# Patient Record
Sex: Male | Born: 1985 | Race: White | Hispanic: No | Marital: Married | Smoking: Never smoker
Health system: Southern US, Community
[De-identification: ages and names within clinical notes are randomized; demographics above are authoritative.]

## PROBLEM LIST (undated history)

## (undated) DIAGNOSIS — F32A Depression, unspecified: Secondary | ICD-10-CM

## (undated) DIAGNOSIS — E538 Deficiency of other specified B group vitamins: Secondary | ICD-10-CM

## (undated) DIAGNOSIS — R51 Headache: Secondary | ICD-10-CM

## (undated) DIAGNOSIS — T7840XA Allergy, unspecified, initial encounter: Secondary | ICD-10-CM

## (undated) DIAGNOSIS — E785 Hyperlipidemia, unspecified: Secondary | ICD-10-CM

## (undated) DIAGNOSIS — F988 Other specified behavioral and emotional disorders with onset usually occurring in childhood and adolescence: Secondary | ICD-10-CM

## (undated) DIAGNOSIS — K648 Other hemorrhoids: Secondary | ICD-10-CM

## (undated) DIAGNOSIS — B019 Varicella without complication: Secondary | ICD-10-CM

## (undated) DIAGNOSIS — G43909 Migraine, unspecified, not intractable, without status migrainosus: Secondary | ICD-10-CM

## (undated) DIAGNOSIS — F329 Major depressive disorder, single episode, unspecified: Secondary | ICD-10-CM

## (undated) DIAGNOSIS — K219 Gastro-esophageal reflux disease without esophagitis: Secondary | ICD-10-CM

## (undated) DIAGNOSIS — F419 Anxiety disorder, unspecified: Secondary | ICD-10-CM

## (undated) HISTORY — DX: Gastro-esophageal reflux disease without esophagitis: K21.9

## (undated) HISTORY — DX: Other specified behavioral and emotional disorders with onset usually occurring in childhood and adolescence: F98.8

## (undated) HISTORY — DX: Deficiency of other specified B group vitamins: E53.8

## (undated) HISTORY — DX: Major depressive disorder, single episode, unspecified: F32.9

## (undated) HISTORY — DX: Varicella without complication: B01.9

## (undated) HISTORY — DX: Other hemorrhoids: K64.8

## (undated) HISTORY — DX: Depression, unspecified: F32.A

## (undated) HISTORY — DX: Headache: R51

## (undated) HISTORY — PX: WISDOM TOOTH EXTRACTION: SHX21

## (undated) HISTORY — DX: Migraine, unspecified, not intractable, without status migrainosus: G43.909

## (undated) HISTORY — DX: Allergy, unspecified, initial encounter: T78.40XA

## (undated) HISTORY — DX: Anxiety disorder, unspecified: F41.9

## (undated) HISTORY — DX: Hyperlipidemia, unspecified: E78.5

---

## 2012-06-20 DIAGNOSIS — Z9109 Other allergy status, other than to drugs and biological substances: Secondary | ICD-10-CM | POA: Insufficient documentation

## 2012-09-27 ENCOUNTER — Ambulatory Visit (INDEPENDENT_AMBULATORY_CARE_PROVIDER_SITE_OTHER): Payer: 59 | Admitting: Family Medicine

## 2012-09-27 ENCOUNTER — Encounter: Payer: Self-pay | Admitting: Family Medicine

## 2012-09-27 VITALS — BP 120/84 | HR 72 | Temp 97.9°F | Resp 12 | Ht 71.0 in | Wt 200.0 lb

## 2012-09-27 DIAGNOSIS — K219 Gastro-esophageal reflux disease without esophagitis: Secondary | ICD-10-CM

## 2012-09-27 DIAGNOSIS — F329 Major depressive disorder, single episode, unspecified: Secondary | ICD-10-CM

## 2012-09-27 DIAGNOSIS — E781 Pure hyperglyceridemia: Secondary | ICD-10-CM | POA: Insufficient documentation

## 2012-09-27 DIAGNOSIS — F32A Depression, unspecified: Secondary | ICD-10-CM

## 2012-09-27 DIAGNOSIS — E785 Hyperlipidemia, unspecified: Secondary | ICD-10-CM

## 2012-09-27 DIAGNOSIS — F3289 Other specified depressive episodes: Secondary | ICD-10-CM

## 2012-09-27 MED ORDER — OMEPRAZOLE 40 MG PO CPDR: 40.0000 mg | DELAYED_RELEASE_CAPSULE | Freq: Every day | ORAL | Status: DC

## 2012-09-27 MED ORDER — OMEGA-3-ACID ETHYL ESTERS 1 G PO CAPS: 1.0000 g | ORAL_CAPSULE | Freq: Two times a day (BID) | ORAL | Status: DC

## 2012-09-27 NOTE — Progress Notes (Signed)
  Subjective:    Patient ID: Timothy Foster, male    DOB: 1986/05/24, 27 y.o.   MRN: [id number]  HPI New to establish care. Patient has history of GERD, past history depression and anxiety and possible generalized anxiety, dyslipidemia. He's been exercising more and has lost some weight this past year. He reports triglycerides previously around 1500. Started on Lovaza several months ago and no followup lipid since then. No regular alcohol use. No history of diabetes. He also has history of seasonal and perennial allergies and takes Allegra for that. Anxiety symptoms are stable on Effexor.  Family history significant for mother with hepatitis C and liver transplant. Both parents with type 2 diabetes. Father had stroke and hypertension history.  Patient is single. Flight attendant. Nonsmoker. Rare alcohol use.  Past Medical History  Diagnosis Date  . Chicken pox   . Depression   . Headache   . Allergy   . Hyperlipidemia    History reviewed. No pertinent past surgical history.  reports that he has never smoked. He does not have any smokeless tobacco history on file. His alcohol and drug histories are not on file. family history includes Alcohol abuse in his maternal uncle and paternal uncle; Cancer in his paternal uncle; Diabetes in his father and mother; and Stroke in his father. Allergies  Allergen Reactions  . Penicillins     Hives as a child      Review of Systems  Constitutional: Negative for fatigue.  Eyes: Negative for visual disturbance.  Respiratory: Negative for cough, chest tightness and shortness of breath.   Cardiovascular: Negative for chest pain, palpitations and leg swelling.  Neurological: Negative for dizziness, syncope, weakness, light-headedness and headaches.       Objective:   Physical Exam  Constitutional: He appears well-developed and well-nourished.  HENT:  Right Ear: External ear normal.  Left Ear: External ear normal.  Neck: Neck supple. No  thyromegaly present.  Cardiovascular: Normal rate and regular rhythm.   No murmur heard. Pulmonary/Chest: Effort normal and breath sounds normal. No respiratory distress. He has no wheezes. He has no rales.  Lymphadenopathy:    He has no cervical adenopathy.          Assessment & Plan:  #1 GERD. Symptomatically stable. Refilled omeprazole for one year.  Chronic omeprazole used with occasional leg cramps. Check magnesium level #2 dyslipidemia with especially high triglycerides. Ordered repeat fasting lipid in about one month. #3 history of chronic anxiety/depression currently stable on Effexor XR.

## 2012-09-27 NOTE — Patient Instructions (Signed)
Hypertriglyceridemia  Diet for High blood levels of Triglycerides Most fats in food are triglycerides. Triglycerides in your blood are stored as fat in your body. High levels of triglycerides in your blood may put you at a greater risk for heart disease and stroke.  Normal triglyceride levels are less than 150 mg/dL. Borderline high levels are 150-199 mg/dl. High levels are 200 - 499 mg/dL, and very high triglyceride levels are greater than 500 mg/dL. The decision to treat high triglycerides is generally based on the level. For people with borderline or high triglyceride levels, treatment includes weight loss and exercise. Drugs are recommended for people with very high triglyceride levels. Many people who need treatment for high triglyceride levels have metabolic syndrome. This syndrome is a collection of disorders that often include: insulin resistance, high blood pressure, blood clotting problems, high cholesterol and triglycerides. TESTING PROCEDURE FOR TRIGLYCERIDES  You should not eat 4 hours before getting your triglycerides measured. The normal range of triglycerides is between 10 and 250 milligrams per deciliter (mg/dl). Some people may have extreme levels (1000 or above), but your triglyceride level may be too high if it is above 150 mg/dl, depending on what other risk factors you have for heart disease.  People with high blood triglycerides may also have high blood cholesterol levels. If you have high blood cholesterol as well as high blood triglycerides, your risk for heart disease is probably greater than if you only had high triglycerides. High blood cholesterol is one of the main risk factors for heart disease. CHANGING YOUR DIET  Your weight can affect your blood triglyceride level. If you are more than 20% above your ideal body weight, you may be able to lower your blood triglycerides by losing weight. Eating less and exercising regularly is the best way to combat this. Fat provides more  calories than any other food. The best way to lose weight is to eat less fat. Only 30% of your total calories should come from fat. Less than 7% of your diet should come from saturated fat. A diet low in fat and saturated fat is the same as a diet to decrease blood cholesterol. By eating a diet lower in fat, you may lose weight, lower your blood cholesterol, and lower your blood triglyceride level.  Eating a diet low in fat, especially saturated fat, may also help you lower your blood triglyceride level. Ask your dietitian to help you figure how much fat you can eat based on the number of calories your caregiver has prescribed for you.  Exercise, in addition to helping with weight loss may also help lower triglyceride levels.   Alcohol can increase blood triglycerides. You may need to stop drinking alcoholic beverages.  Too much carbohydrate in your diet may also increase your blood triglycerides. Some complex carbohydrates are necessary in your diet. These may include bread, rice, potatoes, other starchy vegetables and cereals.  Reduce "simple" carbohydrates. These may include pure sugars, candy, honey, and jelly without losing other nutrients. If you have the kind of high blood triglycerides that is affected by the amount of carbohydrates in your diet, you will need to eat less sugar and less high-sugar foods. Your caregiver can help you with this.  Adding 2-4 grams of fish oil (EPA+ DHA) may also help lower triglycerides. Speak with your caregiver before adding any supplements to your regimen. Following the Diet  Maintain your ideal weight. Your caregivers can help you with a diet. Generally, eating less food and getting more   exercise will help you lose weight. Joining a weight control group may also help. Ask your caregivers for a good weight control group in your area.  Eat low-fat foods instead of high-fat foods. This can help you lose weight too.  These foods are lower in fat. Eat MORE of these:    Dried beans, peas, and lentils.  Egg whites.  Low-fat cottage cheese.  Fish.  Lean cuts of meat, such as round, sirloin, rump, and flank (cut extra fat off meat you fix).  Whole grain breads, cereals and pasta.  Skim and nonfat dry milk.  Low-fat yogurt.  Poultry without the skin.  Cheese made with skim or part-skim milk, such as mozzarella, parmesan, farmers', ricotta, or pot cheese. These are higher fat foods. Eat LESS of these:   Whole milk and foods made from whole milk, such as American, blue, cheddar, monterey jack, and swiss cheese  High-fat meats, such as luncheon meats, sausages, knockwurst, bratwurst, hot dogs, ribs, corned beef, ground pork, and regular ground beef.  Fried foods. Limit saturated fats in your diet. Substituting unsaturated fat for saturated fat may decrease your blood triglyceride level. You will need to read package labels to know which products contain saturated fats.  These foods are high in saturated fat. Eat LESS of these:   Fried pork skins.  Whole milk.  Skin and fat from poultry.  Palm oil.  Butter.  Shortening.  Cream cheese.  Bacon.  Margarines and baked goods made from listed oils.  Vegetable shortenings.  Chitterlings.  Fat from meats.  Coconut oil.  Palm kernel oil.  Lard.  Cream.  Sour cream.  Fatback.  Coffee whiteners and non-dairy creamers made with these oils.  Cheese made from whole milk. Use unsaturated fats (both polyunsaturated and monounsaturated) moderately. Remember, even though unsaturated fats are better than saturated fats; you still want a diet low in total fat.  These foods are high in unsaturated fat:   Canola oil.  Sunflower oil.  Mayonnaise.  Almonds.  Peanuts.  Pine nuts.  Margarines made with these oils.  Safflower oil.  Olive oil.  Avocados.  Cashews.  Peanut butter.  Sunflower seeds.  Soybean oil.  Peanut  oil.  Olives.  Pecans.  Walnuts.  Pumpkin seeds. Avoid sugar and other high-sugar foods. This will decrease carbohydrates without decreasing other nutrients. Sugar in your food goes rapidly to your blood. When there is excess sugar in your blood, your liver may use it to make more triglycerides. Sugar also contains calories without other important nutrients.  Eat LESS of these:   Sugar, brown sugar, powdered sugar, jam, jelly, preserves, honey, syrup, molasses, pies, candy, cakes, cookies, frosting, pastries, colas, soft drinks, punches, fruit drinks, and regular gelatin.  Avoid alcohol. Alcohol, even more than sugar, may increase blood triglycerides. In addition, alcohol is high in calories and low in nutrients. Ask for sparkling water, or a diet soft drink instead of an alcoholic beverage. Suggestions for planning and preparing meals   Bake, broil, grill or roast meats instead of frying.  Remove fat from meats and skin from poultry before cooking.  Add spices, herbs, lemon juice or vinegar to vegetables instead of salt, rich sauces or gravies.  Use a non-stick skillet without fat or use no-stick sprays.  Cool and refrigerate stews and broth. Then remove the hardened fat floating on the surface before serving.  Refrigerate meat drippings and skim off fat to make low-fat gravies.  Serve more fish.  Use less butter,   margarine and other high-fat spreads on bread or vegetables.  Use skim or reconstituted non-fat dry milk for cooking.  Cook with low-fat cheeses.  Substitute low-fat yogurt or cottage cheese for all or part of the sour cream in recipes for sauces, dips or congealed salads.  Use half yogurt/half mayonnaise in salad recipes.  Substitute evaporated skim milk for cream. Evaporated skim milk or reconstituted non-fat dry milk can be whipped and substituted for whipped cream in certain recipes.  Choose fresh fruits for dessert instead of high-fat foods such as pies or  cakes. Fruits are naturally low in fat. When Dining Out   Order low-fat appetizers such as fruit or vegetable juice, pasta with vegetables or tomato sauce.  Select clear, rather than cream soups.  Ask that dressings and gravies be served on the side. Then use less of them.  Order foods that are baked, broiled, poached, steamed, stir-fried, or roasted.  Ask for margarine instead of butter, and use only a small amount.  Drink sparkling water, unsweetened tea or coffee, or diet soft drinks instead of alcohol or other sweet beverages. QUESTIONS AND ANSWERS ABOUT OTHER FATS IN THE BLOOD: SATURATED FAT, TRANS FAT, AND CHOLESTEROL What is trans fat? Trans fat is a type of fat that is formed when vegetable oil is hardened through a process called hydrogenation. This process helps makes foods more solid, gives them shape, and prolongs their shelf life. Trans fats are also called hydrogenated or partially hydrogenated oils.  What do saturated fat, trans fat, and cholesterol in foods have to do with heart disease? Saturated fat, trans fat, and cholesterol in the diet all raise the level of LDL "bad" cholesterol in the blood. The higher the LDL cholesterol, the greater the risk for coronary heart disease (CHD). Saturated fat and trans fat raise LDL similarly.  What foods contain saturated fat, trans fat, and cholesterol? High amounts of saturated fat are found in animal products, such as fatty cuts of meat, chicken skin, and full-fat dairy products like butter, whole milk, cream, and cheese, and in tropical vegetable oils such as palm, palm kernel, and coconut oil. Trans fat is found in some of the same foods as saturated fat, such as vegetable shortening, some margarines (especially hard or stick margarine), crackers, cookies, baked goods, fried foods, salad dressings, and other processed foods made with partially hydrogenated vegetable oils. Small amounts of trans fat also occur naturally in some animal  products, such as milk products, beef, and lamb. Foods high in cholesterol include liver, other organ meats, egg yolks, shrimp, and full-fat dairy products. How can I use the new food label to make heart-healthy food choices? Check the Nutrition Facts panel of the food label. Choose foods lower in saturated fat, trans fat, and cholesterol. For saturated fat and cholesterol, you can also use the Percent Daily Value (%DV): 5% DV or less is low, and 20% DV or more is high. (There is no %DV for trans fat.) Use the Nutrition Facts panel to choose foods low in saturated fat and cholesterol, and if the trans fat is not listed, read the ingredients and limit products that list shortening or hydrogenated or partially hydrogenated vegetable oil, which tend to be high in trans fat. POINTS TO REMEMBER:   Discuss your risk for heart disease with your caregivers, and take steps to reduce risk factors.  Change your diet. Choose foods that are low in saturated fat, trans fat, and cholesterol.  Add exercise to your daily routine if   it is not already being done. Participate in physical activity of moderate intensity, like brisk walking, for at least 30 minutes on most, and preferably all days of the week. No time? Break the 30 minutes into three, 10-minute segments during the day.  Stop smoking. If you do smoke, contact your caregiver to discuss ways in which they can help you quit.  Do not use street drugs.  Maintain a normal weight.  Maintain a healthy blood pressure.  Keep up with your blood work for checking the fats in your blood as directed by your caregiver. Document Released: 05/06/2004 Document Revised: 01/18/2012 Document Reviewed: 12/02/2008 [employer] Patient Information 2013 [employer].  

## 2012-10-20 ENCOUNTER — Other Ambulatory Visit (INDEPENDENT_AMBULATORY_CARE_PROVIDER_SITE_OTHER): Payer: 59

## 2012-10-20 DIAGNOSIS — E785 Hyperlipidemia, unspecified: Secondary | ICD-10-CM

## 2012-10-20 LAB — LIPID PANEL
Cholesterol: 139 mg/dL (ref 0–200)
HDL: 26.2 mg/dL — ABNORMAL LOW (ref >39.00)
Total CHOL/HDL Ratio: 5
Triglycerides: 382 mg/dL — ABNORMAL HIGH (ref 0.0–149.0)
VLDL: 76.4 mg/dL — ABNORMAL HIGH (ref 0.0–40.0)

## 2012-10-20 LAB — LDL CHOLESTEROL, DIRECT: Direct LDL: 53.4 mg/dL

## 2012-10-20 LAB — MAGNESIUM: Magnesium: 2 mg/dL (ref 1.5–2.5)

## 2012-10-23 ENCOUNTER — Encounter: Payer: Self-pay | Admitting: Family Medicine

## 2012-10-23 NOTE — Progress Notes (Signed)
Quick Note:  I mailed labs to pt home as his phone number "is restricted" ______

## 2012-12-26 ENCOUNTER — Encounter: Payer: Self-pay | Admitting: Family Medicine

## 2012-12-26 ENCOUNTER — Ambulatory Visit (INDEPENDENT_AMBULATORY_CARE_PROVIDER_SITE_OTHER): Payer: 59 | Admitting: Family Medicine

## 2012-12-26 VITALS — BP 130/90 | HR 108 | Temp 98.2°F | Wt 196.0 lb

## 2012-12-26 DIAGNOSIS — J019 Acute sinusitis, unspecified: Secondary | ICD-10-CM

## 2012-12-26 MED ORDER — CEFUROXIME AXETIL 500 MG PO TABS: 500.0000 mg | ORAL_TABLET | Freq: Two times a day (BID) | ORAL | Status: DC

## 2012-12-26 NOTE — Progress Notes (Signed)
  Subjective:    Patient ID: Timothy Foster, male    DOB: [birth year], 27 y.o.   MRN: [id number]  HPI Here for 10 days of sinus pressure, HA, PND, and ST. No cough. On Sudafed and Mucinex. 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    Review of Systems  Constitutional: Negative.   HENT: Positive for congestion, postnasal drip and sinus pressure.   Eyes: Negative.   Respiratory: Negative.        Objective:   Physical Exam  Constitutional: He appears well-developed and well-nourished.  HENT:  Right Ear: External ear normal.  Left Ear: External ear normal.  Nose: Nose normal.  Mouth/Throat: Oropharynx is clear and moist.  Eyes: Conjunctivae are normal.  Pulmonary/Chest: Effort normal and breath sounds normal.  Lymphadenopathy:    He has no cervical adenopathy.          Assessment & Plan:  Recheck prn

## 2013-01-15 ENCOUNTER — Encounter: Payer: Self-pay | Admitting: Family Medicine

## 2013-01-15 ENCOUNTER — Ambulatory Visit (INDEPENDENT_AMBULATORY_CARE_PROVIDER_SITE_OTHER): Payer: 59 | Admitting: Family Medicine

## 2013-01-15 VITALS — BP 110/72 | Temp 98.2°F | Wt 199.0 lb

## 2013-01-15 DIAGNOSIS — J309 Allergic rhinitis, unspecified: Secondary | ICD-10-CM

## 2013-01-15 DIAGNOSIS — Z23 Encounter for immunization: Secondary | ICD-10-CM

## 2013-01-15 DIAGNOSIS — J029 Acute pharyngitis, unspecified: Secondary | ICD-10-CM

## 2013-01-15 DIAGNOSIS — S0300XA Dislocation of jaw, unspecified side, initial encounter: Secondary | ICD-10-CM

## 2013-01-15 LAB — POCT RAPID STREP A (OFFICE): Rapid Strep A Screen: NEGATIVE

## 2013-01-15 NOTE — Patient Instructions (Signed)
Use your nasonex daily  Continue your allergy medication daily  Jaw exercises provided  Follow up as needed

## 2013-01-15 NOTE — Progress Notes (Signed)
Chief Complaint  Patient presents with  . Sore Throat    x 5 days, ear pain; worse at night     HPI:  Acute visit for sore throat: -started 5 days ago -symptoms: above + L ear pain, drainage in throat, cough -denies: sinus pain, fevers,chills, SOB, wheezing, NVD -had sinus infection a few weeks ago and finished course of abx about 1.5 weeks ago -denies known strep exposure  L Jaw pain and clicking at times: -no sticking or lack jaw  ROS: See pertinent positives and negatives per HPI.  Past Medical History  Diagnosis Date  . Chicken pox   . Depression   . Headache(784.0)   . Allergy   . Hyperlipidemia     Family History  Problem Relation Age of Onset  . Diabetes Mother   . Stroke Father   . Diabetes Father   . Alcohol abuse Maternal Uncle   . Alcohol abuse Paternal Uncle   . Cancer Paternal Uncle     History   Social History  . Marital Status: Single    Spouse Name: N/A    Number of Children: N/A  . Years of Education: N/A   Social History Main Topics  . Smoking status: Never Smoker   . Smokeless tobacco: Never Used  . Alcohol Use: Yes     Comment: couple times per month  . Drug Use: No  . Sexually Active: None   Other Topics Concern  . None   Social History Narrative  . None    Current outpatient prescriptions:cefUROXime (CEFTIN) 500 MG tablet, Take 1 tablet (500 mg total) by mouth 2 (two) times daily., Disp: 20 tablet, Rfl: 0;  fexofenadine (ALLEGRA) 180 MG tablet, Take 180 mg by mouth daily., Disp: , Rfl: ;  omega-3 acid ethyl esters (LOVAZA) 1 G capsule, Take 1 g by mouth 3 (three) times daily., Disp: , Rfl: ;  omeprazole (PRILOSEC) 40 MG capsule, Take 1 capsule (40 mg total) by mouth daily., Disp: 90 capsule, Rfl: 3 venlafaxine XR (EFFEXOR-XR) 150 MG 24 hr capsule, Take 150 mg by mouth daily., Disp: , Rfl:   EXAM:  Filed Vitals:   01/15/13 1404  BP: 110/72  Temp: 98.2 F (36.8 C)    Body mass index is 27.77 kg/(m^2).  GENERAL: vitals  reviewed and listed above, alert, oriented, appears well hydrated and in no acute distress  HEENT: atraumatic, conjunttiva clear, no obvious abnormalities on inspection of external nose and ears, normal appearance of ear canals and TMs, clear nasal congestion with pale boggy turbinates, mild post oropharyngeal erythema with PND, no tonsillar edema or exudate, no sinus TTP  NECK: no obvious masses on inspection  LUNGS: clear to auscultation bilaterally, no wheezes, rales or rhonchi, good air movement  CV: HRRR, no peripheral edema  MS: moves all extremities without noticeable abnormality - mild clicking L TMJ with opening of jaw, no deviation of jaw or sticking  PSYCH: pleasant and cooperative, no obvious depression or anxiety  ASSESSMENT AND PLAN:  Discussed the following assessment and plan:  Allergic rhinosinusitis  TMJ (dislocation of temporomandibular joint), initial encounter  -rapid strep neg, restart nasal CS for allergies, continue antihistamine -isometric jaw forward and opening exercises provided for mild TMJ -Recommendations per orders an instructions, risks and use of medications and return precautions discussed. -Patient advised to return or notify a doctor immediately if symptoms worsen or persist or new concerns arise.  Patient Instructions  Use your nasonex daily  Continue your allergy medication daily  Jaw exercises provided  Follow up as needed     Yarenis Cerino, Dahlia Client R.

## 2013-02-02 ENCOUNTER — Other Ambulatory Visit: Payer: Self-pay | Admitting: Family Medicine

## 2013-02-05 MED ORDER — OMEGA-3-ACID ETHYL ESTERS 1 G PO CAPS: 1.0000 g | ORAL_CAPSULE | Freq: Three times a day (TID) | ORAL | Status: DC

## 2013-02-28 ENCOUNTER — Encounter: Payer: Self-pay | Admitting: Family Medicine

## 2013-03-06 ENCOUNTER — Other Ambulatory Visit: Payer: Self-pay

## 2013-03-06 MED ORDER — VENLAFAXINE HCL ER 150 MG PO CP24: 150.0000 mg | ORAL_CAPSULE | Freq: Every day | ORAL | Status: DC

## 2013-05-14 ENCOUNTER — Encounter: Payer: Self-pay | Admitting: Gastroenterology

## 2013-05-16 ENCOUNTER — Encounter: Payer: Self-pay | Admitting: Family Medicine

## 2013-05-16 ENCOUNTER — Ambulatory Visit (INDEPENDENT_AMBULATORY_CARE_PROVIDER_SITE_OTHER): Payer: 59 | Admitting: Family Medicine

## 2013-05-16 VITALS — BP 132/80 | HR 95 | Temp 97.8°F | Wt 205.0 lb

## 2013-05-16 DIAGNOSIS — R197 Diarrhea, unspecified: Secondary | ICD-10-CM

## 2013-05-16 DIAGNOSIS — R1013 Epigastric pain: Secondary | ICD-10-CM

## 2013-05-16 LAB — CBC WITH DIFFERENTIAL/PLATELET
Basophils Absolute: 0.1 10*3/uL (ref 0.0–0.1)
Basophils Relative: 0.9 % (ref 0.0–3.0)
Eosinophils Absolute: 0.2 10*3/uL (ref 0.0–0.7)
Eosinophils Relative: 4.1 % (ref 0.0–5.0)
HCT: 40.8 % (ref 39.0–52.0)
Hemoglobin: 14.4 g/dL (ref 13.0–17.0)
Lymphocytes Relative: 37.9 % (ref 12.0–46.0)
Lymphs Abs: 2.1 10*3/uL (ref 0.7–4.0)
MCHC: 35.4 g/dL (ref 30.0–36.0)
MCV: 92.6 fl (ref 78.0–100.0)
Monocytes Absolute: 0.5 10*3/uL (ref 0.1–1.0)
Monocytes Relative: 8.3 % (ref 3.0–12.0)
Neutro Abs: 2.7 10*3/uL (ref 1.4–7.7)
Neutrophils Relative %: 48.8 % (ref 43.0–77.0)
Platelets: 174 10*3/uL (ref 150.0–400.0)
RBC: 4.4 Mil/uL (ref 4.22–5.81)
RDW: 12.9 % (ref 11.5–14.6)
WBC: 5.6 10*3/uL (ref 4.5–10.5)

## 2013-05-16 LAB — BASIC METABOLIC PANEL
BUN: 11 mg/dL (ref 6–23)
CO2: 25 mEq/L (ref 19–32)
Calcium: 9.3 mg/dL (ref 8.4–10.5)
Chloride: 105 mEq/L (ref 96–112)
Creatinine, Ser: 1 mg/dL (ref 0.4–1.5)
GFR: 91.94 mL/min (ref >60.00)
Glucose, Bld: 99 mg/dL (ref 70–99)
Potassium: 3.9 mEq/L (ref 3.5–5.1)
Sodium: 139 mEq/L (ref 135–145)

## 2013-05-16 LAB — HEPATIC FUNCTION PANEL
ALT: 55 U/L — ABNORMAL HIGH (ref 0–53)
AST: 28 U/L (ref 0–37)
Albumin: 4.4 g/dL (ref 3.5–5.2)
Alkaline Phosphatase: 86 U/L (ref 39–117)
Bilirubin, Direct: 0 mg/dL (ref 0.0–0.3)
Total Bilirubin: 0.6 mg/dL (ref 0.3–1.2)
Total Protein: 7.1 g/dL (ref 6.0–8.3)

## 2013-05-16 LAB — SEDIMENTATION RATE: Sed Rate: 2 mm/hr (ref 0–22)

## 2013-05-16 NOTE — Progress Notes (Signed)
  Subjective:    Patient ID: Timothy Foster, male    DOB: Mar 17, 1986, 27 y.o.   MRN: [id number]  HPI Patient seen with roughly three-week history of some intermittent nausea and diarrhea. Symptoms seem to occur postprandial and especially following consumption of meats. He has some diffuse midepigastric cramping-like discomfort which usually lasts about 30 minutes. Relatively mild pain. Pain sometimes alleviated with stools. He's taken Pepto-Bismol without relief. He's had some tendencies in the past toward postprandial nausea and diarrhea but not to this extent. He denies any weight loss. No significant appetite changes.  Patient also relates he's had some occasional bright red blood per rectum with most recent episode about 2 weeks ago. No pain with stools. He has an aunt with Crohn's disease, otherwise no family history of inflammatory bowel disease.  Denies any fevers or chills. His weight is actually up a few pounds from last visit. He has long history of GERD which is been controlled with omeprazole which he takes regularly.  Past Medical History  Diagnosis Date  . Chicken pox   . Depression   . Headache(784.0)   . Allergy   . Hyperlipidemia    No past surgical history on file.  reports that he has never smoked. He has never used smokeless tobacco. He reports that he drinks alcohol. He reports that he does not use illicit drugs. family history includes Alcohol abuse in his maternal uncle and paternal uncle; Cancer in his paternal uncle; Diabetes in his father and mother; Stroke in his father. Allergies  Allergen Reactions  . Penicillins     Hives as a child      Review of Systems  Constitutional: Negative for fever, chills, appetite change and unexpected weight change.  HENT: Negative for trouble swallowing.   Respiratory: Negative for cough and shortness of breath.   Cardiovascular: Negative for chest pain.  Gastrointestinal: Positive for nausea, abdominal pain, diarrhea and  blood in stool. Negative for vomiting and abdominal distention.  Neurological: Negative for dizziness and syncope.       Objective:   Physical Exam  HENT:  Head: Normocephalic and atraumatic.  Neck: Neck supple. No thyromegaly present.  Cardiovascular: Normal rate and regular rhythm.   Pulmonary/Chest: Effort normal and breath sounds normal. No respiratory distress. He has no wheezes.  Abdominal: Soft. Bowel sounds are normal. He exhibits no distension and no mass. There is no tenderness. There is no rebound and no guarding.  Musculoskeletal: He exhibits no edema.          Assessment & Plan:  Patient presents with 3 week history of intermittent nausea, upper abdominal cramping and diarrhea. He denies any red flags such as fever or weight loss. He has scheduled followup with gastroenterology. We'll obtain labs with sedimentation rate, CBC, basic metabolic panel, and hepatic panel. Patient was given parameters for followup sooner.

## 2013-05-16 NOTE — Patient Instructions (Signed)
Follow up immediately for any fever, progressive pain, recurrent bloody stools or any new symptoms.

## 2013-06-07 ENCOUNTER — Other Ambulatory Visit: Payer: Self-pay

## 2013-06-19 ENCOUNTER — Other Ambulatory Visit: Payer: 59

## 2013-06-19 ENCOUNTER — Ambulatory Visit (INDEPENDENT_AMBULATORY_CARE_PROVIDER_SITE_OTHER): Payer: 59 | Admitting: Gastroenterology

## 2013-06-19 ENCOUNTER — Encounter: Payer: Self-pay | Admitting: Gastroenterology

## 2013-06-19 VITALS — BP 120/96 | HR 92 | Ht 72.0 in | Wt 202.0 lb

## 2013-06-19 DIAGNOSIS — K219 Gastro-esophageal reflux disease without esophagitis: Secondary | ICD-10-CM

## 2013-06-19 DIAGNOSIS — K921 Melena: Secondary | ICD-10-CM

## 2013-06-19 DIAGNOSIS — R197 Diarrhea, unspecified: Secondary | ICD-10-CM

## 2013-06-19 DIAGNOSIS — R1084 Generalized abdominal pain: Secondary | ICD-10-CM

## 2013-06-19 MED ORDER — HYOSCYAMINE SULFATE 0.125 MG SL SUBL: SUBLINGUAL_TABLET | SUBLINGUAL | Status: DC

## 2013-06-19 MED ORDER — PEG-KCL-NACL-NASULF-NA ASC-C 100 G PO SOLR: 1.0000 | Freq: Once | ORAL | Status: DC

## 2013-06-19 NOTE — Patient Instructions (Signed)
Your physician has requested that you go to the basement for the following lab work before leaving today: Celiac panel.  We have sent the following medications to your pharmacy for you to pick up at your convenience: Levsin.  You have been scheduled for a colonoscopy with propofol. Please follow written instructions given to you at your visit today.  Please pick up your prep kit at the pharmacy within the next 1-3 days. If you use inhalers (even only as needed), please bring them with you on the day of your procedure. Your physician has requested that you go to www.startemmi.com and enter the access code given to you at your visit today. This web site gives a general overview about your procedure. However, you should still follow specific instructions given to you by our office regarding your preparation for the procedure.  Thank you for choosing me and [facility].  Venita Lick. Pleas Koch., MD., Clementeen Graham

## 2013-06-19 NOTE — Progress Notes (Signed)
    History of Present Illness: This is a 27 year old male who relates a long history of frequent, urgent postprandial watery diarrhea in association with crampy abdominal pain. He has GERD that has been treated with omeprazole 40 mg daily with good control of symptoms. He recently has had to several day episode of bright red blood per rectum associated with his diarrhea. Recent blood work obtained by Dr. Caryl Never was unremarkable except for a minimally elevated ALT at 55. Denies weight loss, constipation,  change in stool caliber, melena, nausea, vomiting, dysphagia, chest pain.  Review of Systems: Pertinent positive and negative review of systems were noted in the above HPI section. All other review of systems were otherwise negative.  Current Medications, Allergies, Past Medical History, Past Surgical History, Family History and Social History were reviewed in [employer] record.  Physical Exam: General: Well developed , well nourished, no acute distress Head: Normocephalic and atraumatic Eyes:  sclerae anicteric, EOMI Ears: Normal auditory acuity Mouth: No deformity or lesions Neck: Supple, no masses or thyromegaly Lungs: Clear throughout to auscultation Heart: Regular rate and rhythm; no murmurs, rubs or bruits Abdomen: Soft, non tender and non distended. No masses, hepatosplenomegaly or hernias noted. Normal Bowel sounds Rectal: No lesions, no tenderness, Hemoccult-negative soft brown stool in the vault Musculoskeletal: Symmetrical with no gross deformities  Skin: No lesions on visible extremities Pulses:  Normal pulses noted Extremities: No clubbing, cyanosis, edema or deformities noted Neurological: Alert oriented x 4, grossly nonfocal Cervical Nodes:  No significant cervical adenopathy Inguinal Nodes: No significant inguinal adenopathy Psychological:  Alert and cooperative. Normal mood and affect  Assessment and Recommendations:  1. Diarrhea,  hematochezia, abdominal cramping. Suspected IBS and hemorrhoids. R/O IBD, celiac disease and other disorders. Celiac panel. Begin hyoscyamine 1-2 a.c. The risks, benefits, and alternatives to colonoscopy with possible biopsy and possible polypectomy were discussed with the patient and they consent to proceed.   2. ALT elevated. Recheck LFTs in 1 month.  3. GERD. Continue standard antireflux measures and omeprazole 40 mg daily.

## 2013-06-20 LAB — CELIAC PANEL 10
Endomysial Screen: NEGATIVE
Gliadin IgA: 9.9 U/mL (ref <20)
Gliadin IgG: 4.8 U/mL (ref <20)
IgA: 330 mg/dL (ref 68–379)
Tissue Transglut Ab: 9.9 U/mL (ref <20)
Tissue Transglutaminase Ab, IgA: 5.6 U/mL (ref <20)

## 2013-06-21 ENCOUNTER — Encounter: Payer: Self-pay | Admitting: Gastroenterology

## 2013-07-30 ENCOUNTER — Ambulatory Visit (INDEPENDENT_AMBULATORY_CARE_PROVIDER_SITE_OTHER): Payer: 59 | Admitting: Family Medicine

## 2013-07-30 VITALS — BP 110/75 | HR 99 | Temp 99.7°F | Resp 18 | Wt 205.0 lb

## 2013-07-30 DIAGNOSIS — B349 Viral infection, unspecified: Secondary | ICD-10-CM

## 2013-07-30 DIAGNOSIS — R112 Nausea with vomiting, unspecified: Secondary | ICD-10-CM

## 2013-07-30 DIAGNOSIS — B9789 Other viral agents as the cause of diseases classified elsewhere: Secondary | ICD-10-CM

## 2013-07-30 DIAGNOSIS — R6889 Other general symptoms and signs: Secondary | ICD-10-CM

## 2013-07-30 LAB — POCT INFLUENZA A/B
Influenza A, POC: NEGATIVE
Influenza B, POC: NEGATIVE

## 2013-07-30 MED ORDER — ONDANSETRON 4 MG PO TBDP: 4.0000 mg | ORAL_TABLET | Freq: Three times a day (TID) | ORAL | Status: DC | PRN

## 2013-07-30 MED ORDER — OSELTAMIVIR PHOSPHATE 75 MG PO CAPS: 75.0000 mg | ORAL_CAPSULE | Freq: Two times a day (BID) | ORAL | Status: DC

## 2013-07-30 MED ORDER — HYDROCODONE-HOMATROPINE 5-1.5 MG/5ML PO SYRP: 5.0000 mL | ORAL_SOLUTION | ORAL | Status: DC | PRN

## 2013-07-30 NOTE — Patient Instructions (Signed)
Drink plenty of fluids and get enough rest  Take Tamiflu one twice daily  Hycodan 1 teaspoon every 4-6 hours as needed for cough  Zofran if needed for nausea  Return if worse

## 2013-07-30 NOTE — Progress Notes (Signed)
Subjective: Patient is here for a one-day history of generalized aching, fever, headache, slight cough, feeling lousy. Took some Advil earlier today. Works as a [employer]. Last 2 days ago.  Objective: Somewhat pale appearing man in no major distress laying on the exam table. TMs normal. Throat clear. Neck supple without nodes. Chest clear. Heart regular without murmurs. fevers noted.  Assessment: flulike  Results for orders placed in visit on 07/30/13  POCT INFLUENZA A/B      Result Value Range   Influenza A, POC Negative     Influenza B, POC Negative      Clinical flu. Will treat accordingly. See instructions and prescriptions

## 2013-08-07 ENCOUNTER — Encounter: Payer: Self-pay | Admitting: Gastroenterology

## 2013-08-07 ENCOUNTER — Ambulatory Visit (AMBULATORY_SURGERY_CENTER): Payer: BC Managed Care – PPO | Admitting: Gastroenterology

## 2013-08-07 VITALS — BP 114/60 | HR 68 | Temp 96.4°F | Resp 15 | Ht 72.0 in | Wt 202.0 lb

## 2013-08-07 DIAGNOSIS — R197 Diarrhea, unspecified: Secondary | ICD-10-CM

## 2013-08-07 DIAGNOSIS — D126 Benign neoplasm of colon, unspecified: Secondary | ICD-10-CM

## 2013-08-07 DIAGNOSIS — K921 Melena: Secondary | ICD-10-CM

## 2013-08-07 MED ORDER — SODIUM CHLORIDE 0.9 % IV SOLN: 500.0000 mL | INTRAVENOUS | Status: DC

## 2013-08-07 NOTE — Progress Notes (Signed)
Called to room to assist during endoscopic procedure.  Patient ID and intended procedure confirmed with present staff. Received instructions for my participation in the procedure from the performing physician.  

## 2013-08-07 NOTE — Patient Instructions (Signed)
YOU HAD AN ENDOSCOPIC PROCEDURE TODAY AT THE [facility]: Refer to the procedure report that was given to you for any specific questions about what was found during the examination.  If the procedure report does not answer your questions, please call your gastroenterologist to clarify.  If you requested that your care partner not be given the details of your procedure findings, then the procedure report has been included in a sealed envelope for you to review at your convenience later.  YOU SHOULD EXPECT: Some feelings of bloating in the abdomen. Passage of more gas than usual.  Walking can help get rid of the air that was put into your GI tract during the procedure and reduce the bloating. If you had a lower endoscopy (such as a colonoscopy or flexible sigmoidoscopy) you may notice spotting of blood in your stool or on the toilet paper. If you underwent a bowel prep for your procedure, then you may not have a normal bowel movement for a few days.  DIET: Your first meal following the procedure should be a light meal and then it is ok to progress to your normal diet.  A half-sandwich or bowl of soup is an example of a good first meal.  Heavy or fried foods are harder to digest and may make you feel nauseous or bloated.  Likewise meals heavy in dairy and vegetables can cause extra gas to form and this can also increase the bloating.  Drink plenty of fluids but you should avoid alcoholic beverages for 24 hours.  ACTIVITY: Your care partner should take you home directly after the procedure.  You should plan to take it easy, moving slowly for the rest of the day.  You can resume normal activity the day after the procedure however you should NOT DRIVE or use heavy machinery for 24 hours (because of the sedation medicines used during the test).    SYMPTOMS TO REPORT IMMEDIATELY: A gastroenterologist can be reached at any hour.  During normal business hours, 8:30 AM to 5:00 PM Monday through Friday,  call [phone].  After hours and on weekends, please call the GI answering service at [phone] who will take a message and have the physician on call contact you.   Following lower endoscopy (colonoscopy or flexible sigmoidoscopy):  Excessive amounts of blood in the stool  Significant tenderness or worsening of abdominal pains  Swelling of the abdomen that is new, acute  Fever of 100F or higher  FOLLOW UP: If any biopsies were taken you will be contacted by phone or by letter within the next 1-3 weeks.  Call your gastroenterologist if you have not heard about the biopsies in 3 weeks.  Our staff will call the home number listed on your records the next business day following your procedure to check on you and address any questions or concerns that you may have at that time regarding the information given to you following your procedure. This is a courtesy call and so if there is no answer at the home number and we have not heard from you through the emergency physician on call, we will assume that you have returned to your regular daily activities without incident.  SIGNATURES/CONFIDENTIALITY: You and/or your care partner have signed paperwork which will be entered into your electronic medical record.  These signatures attest to the fact that that the information above on your After Visit Summary has been reviewed and is understood.  Full responsibility of the confidentiality of this   discharge information lies with you and/or your care-partner.  Normal colon with multiple biopsies done  Handout on hemorrhoids  Next colon at age 80

## 2013-08-07 NOTE — Op Note (Signed)
[facility]  [address]. [place], [address]   COLONOSCOPY PROCEDURE REPORT  PATIENT: Timothy, Foster  MR#: [id number] BIRTHDATE: 04-13-1986 , 27  yrs. old GENDER: Male ENDOSCOPIST: Ladene Artist, MD, [facility] REFERRED KK:XFGHW Elease Hashimoto, M.D. PROCEDURE DATE:  08/07/2013 PROCEDURE:   Colonoscopy with biopsy First Screening Colonoscopy - Avg.  risk and is 50 yrs.  old or older - No.  Prior Negative Screening - Now for repeat screening. N/A  History of Adenoma - Now for follow-up colonoscopy & has been > or = to 3 yrs.  N/A  Polyps Removed Today? No.  Recommend repeat exam, <10 yrs? No. ASA CLASS:   Class II INDICATIONS:Rectal Bleeding. MEDICATIONS: MAC sedation, administered by CRNA and propofol (Diprivan) 400mg  IV DESCRIPTION OF PROCEDURE:   After the risks benefits and alternatives of the procedure were thoroughly explained, informed consent was obtained.  A digital rectal exam revealed no abnormalities of the rectum.   The [id number]  endoscope was introduced through the anus and advanced to the terminal ileum which was intubated for a short distance. No adverse events experienced.   The quality of the prep was good, using MoviPrep The instrument was then slowly withdrawn as the colon was fully examined.  COLON FINDINGS: The mucosa appeared normal in the terminal ileum. A normal appearing cecum, ileocecal valve, and appendiceal orifice were identified.  The ascending, hepatic flexure, transverse, splenic flexure, descending, sigmoid colon and rectum appeared unremarkable.  No polyps or cancers were seen.  Multiple random biopsies of the area were performed.  Retroflexed views revealed small internal hemorrhoids. The time to cecum=3 minutes 26 seconds. Withdrawal time=9 minutes 20 seconds.  The scope was withdrawn and the procedure completed. COMPLICATIONS: There were no complications.  ENDOSCOPIC IMPRESSION: 1.   Normal mucosa in the terminal  ileum 2.   Normal colon; multiple random biopsies performed 3.   Small internal hemorrhoids  RECOMMENDATIONS: 1.  Await pathology results 2.  Continue current colorectal screening recommendations for "routine risk" patients with a repeat colonoscopy at age 33 years.  eSigned:  Ladene Artist, MD, [facility] 08/07/2013 4:03 PM

## 2013-08-07 NOTE — Progress Notes (Signed)
Lidocaine-40mg IV prior to Propofol InductionPropofol given over incremental dosages 

## 2013-08-07 NOTE — Progress Notes (Signed)
Patient did not experience any of the following events: a burn prior to discharge; a fall within the facility; wrong site/side/patient/procedure/implant event; or a hospital transfer or hospital admission upon discharge from the facility. (G8907) Patient did not have preoperative order for IV antibiotic SSI prophylaxis. (G8918)  

## 2013-08-08 ENCOUNTER — Telehealth: Payer: Self-pay | Admitting: *Deleted

## 2013-08-08 NOTE — Telephone Encounter (Signed)
  Follow up Call-  Call back number 08/07/2013  Post procedure Call Back phone  # [phone]  Permission to leave phone message Yes     Patient questions:  Do you have a fever, pain , or abdominal swelling? no Pain Score  0 *  Have you tolerated food without any problems? yes  Have you been able to return to your normal activities? yes  Do you have any questions about your discharge instructions: Diet   no Medications  no Follow up visit  no  Do you have questions or concerns about your Care? no  Actions: * If pain score is 4 or above: No action needed, pain <4.

## 2013-08-13 ENCOUNTER — Encounter: Payer: Self-pay | Admitting: Gastroenterology

## 2013-08-27 ENCOUNTER — Telehealth: Payer: Self-pay | Admitting: Family Medicine

## 2013-08-27 ENCOUNTER — Other Ambulatory Visit: Payer: Self-pay | Admitting: Family Medicine

## 2013-08-27 DIAGNOSIS — Z0000 Encounter for general adult medical examination without abnormal findings: Secondary | ICD-10-CM

## 2013-08-27 NOTE — Telephone Encounter (Signed)
Orders are placed 

## 2013-08-27 NOTE — Telephone Encounter (Signed)
Pt needs to have his cpx labs done [facility] due to his job and being out of town. Can youi put in the computer?

## 2013-08-30 ENCOUNTER — Other Ambulatory Visit: Payer: Self-pay | Admitting: *Deleted

## 2013-08-30 ENCOUNTER — Other Ambulatory Visit (INDEPENDENT_AMBULATORY_CARE_PROVIDER_SITE_OTHER): Payer: BC Managed Care – PPO

## 2013-08-30 DIAGNOSIS — Z0000 Encounter for general adult medical examination without abnormal findings: Secondary | ICD-10-CM

## 2013-08-30 LAB — URINALYSIS
Bilirubin Urine: NEGATIVE
Hgb urine dipstick: NEGATIVE
Ketones, ur: NEGATIVE
Leukocytes, UA: NEGATIVE
Nitrite: NEGATIVE
Specific Gravity, Urine: 1.015 (ref 1.000–1.030)
Total Protein, Urine: NEGATIVE
Urine Glucose: NEGATIVE
Urobilinogen, UA: 0.2 (ref 0.0–1.0)
pH: 7.5 (ref 5.0–8.0)

## 2013-08-30 LAB — BASIC METABOLIC PANEL
BUN: 10 mg/dL (ref 6–23)
CO2: 24 mEq/L (ref 19–32)
Calcium: 9.6 mg/dL (ref 8.4–10.5)
Chloride: 107 mEq/L (ref 96–112)
Creatinine, Ser: 1 mg/dL (ref 0.4–1.5)
GFR: 90.72 mL/min (ref >60.00)
Glucose, Bld: 96 mg/dL (ref 70–99)
Potassium: 3.9 mEq/L (ref 3.5–5.1)
Sodium: 140 mEq/L (ref 135–145)

## 2013-08-30 LAB — CBC WITH DIFFERENTIAL/PLATELET
Basophils Absolute: 0 10*3/uL (ref 0.0–0.1)
Basophils Relative: 0.6 % (ref 0.0–3.0)
Eosinophils Absolute: 0.2 10*3/uL (ref 0.0–0.7)
Eosinophils Relative: 4.4 % (ref 0.0–5.0)
HCT: 46 % (ref 39.0–52.0)
Hemoglobin: 15.9 g/dL (ref 13.0–17.0)
Lymphocytes Relative: 48.5 % — ABNORMAL HIGH (ref 12.0–46.0)
Lymphs Abs: 2.8 10*3/uL (ref 0.7–4.0)
MCHC: 34.7 g/dL (ref 30.0–36.0)
MCV: 94.4 fl (ref 78.0–100.0)
Monocytes Absolute: 0.4 10*3/uL (ref 0.1–1.0)
Monocytes Relative: 7.7 % (ref 3.0–12.0)
Neutro Abs: 2.2 10*3/uL (ref 1.4–7.7)
Neutrophils Relative %: 38.8 % — ABNORMAL LOW (ref 43.0–77.0)
Platelets: 179 10*3/uL (ref 150.0–400.0)
RBC: 4.88 Mil/uL (ref 4.22–5.81)
RDW: 13.1 % (ref 11.5–14.6)
WBC: 5.7 10*3/uL (ref 4.5–10.5)

## 2013-08-30 LAB — TSH: TSH: 0.48 u[IU]/mL (ref 0.35–5.50)

## 2013-08-30 LAB — LDL CHOLESTEROL, DIRECT: Direct LDL: 53.1 mg/dL

## 2013-08-30 LAB — LIPID PANEL
Cholesterol: 179 mg/dL (ref 0–200)
HDL: 30.5 mg/dL — ABNORMAL LOW (ref >39.00)
Total CHOL/HDL Ratio: 6
Triglycerides: 681 mg/dL — ABNORMAL HIGH (ref 0.0–149.0)
VLDL: 136.2 mg/dL — ABNORMAL HIGH (ref 0.0–40.0)

## 2013-08-30 LAB — HEPATIC FUNCTION PANEL
ALT: 129 U/L — ABNORMAL HIGH (ref 0–53)
AST: 62 U/L — ABNORMAL HIGH (ref 0–37)
Albumin: 4.5 g/dL (ref 3.5–5.2)
Alkaline Phosphatase: 77 U/L (ref 39–117)
Bilirubin, Direct: 0.2 mg/dL (ref 0.0–0.3)
Total Bilirubin: 1.4 mg/dL — ABNORMAL HIGH (ref 0.3–1.2)
Total Protein: 7.7 g/dL (ref 6.0–8.3)

## 2013-09-10 ENCOUNTER — Other Ambulatory Visit: Payer: BC Managed Care – PPO

## 2013-09-14 ENCOUNTER — Other Ambulatory Visit: Payer: Self-pay

## 2013-09-14 ENCOUNTER — Ambulatory Visit (INDEPENDENT_AMBULATORY_CARE_PROVIDER_SITE_OTHER): Payer: BC Managed Care – PPO | Admitting: Family Medicine

## 2013-09-14 ENCOUNTER — Encounter: Payer: Self-pay | Admitting: Family Medicine

## 2013-09-14 VITALS — BP 128/90 | HR 98 | Temp 97.5°F | Ht 72.0 in | Wt 205.0 lb

## 2013-09-14 DIAGNOSIS — E785 Hyperlipidemia, unspecified: Secondary | ICD-10-CM

## 2013-09-14 DIAGNOSIS — Z0000 Encounter for general adult medical examination without abnormal findings: Secondary | ICD-10-CM

## 2013-09-14 MED ORDER — OMEPRAZOLE 40 MG PO CPDR: 40.0000 mg | DELAYED_RELEASE_CAPSULE | Freq: Every day | ORAL | Status: DC

## 2013-09-14 MED ORDER — VENLAFAXINE HCL ER 150 MG PO CP24: 150.0000 mg | ORAL_CAPSULE | Freq: Every day | ORAL | Status: DC

## 2013-09-14 MED ORDER — OMEGA-3-ACID ETHYL ESTERS 1 G PO CAPS: 1.0000 g | ORAL_CAPSULE | Freq: Three times a day (TID) | ORAL | Status: DC

## 2013-09-14 NOTE — Patient Instructions (Addendum)
Hypertriglyceridemia  Diet for High blood levels of Triglycerides Most fats in food are triglycerides. Triglycerides in your blood are stored as fat in your body. High levels of triglycerides in your blood may put you at a greater risk for heart disease and stroke.  Normal triglyceride levels are less than 150 mg/dL. Borderline high levels are 150-199 mg/dl. High levels are 200 - 499 mg/dL, and very high triglyceride levels are greater than 500 mg/dL. The decision to treat high triglycerides is generally based on the level. For people with borderline or high triglyceride levels, treatment includes weight loss and exercise. Drugs are recommended for people with very high triglyceride levels. Many people who need treatment for high triglyceride levels have metabolic syndrome. This syndrome is a collection of disorders that often include: insulin resistance, high blood pressure, blood clotting problems, high cholesterol and triglycerides. TESTING PROCEDURE FOR TRIGLYCERIDES  You should not eat 4 hours before getting your triglycerides measured. The normal range of triglycerides is between 10 and 250 milligrams per deciliter (mg/dl). Some people may have extreme levels (1000 or above), but your triglyceride level may be too high if it is above 150 mg/dl, depending on what other risk factors you have for heart disease.  People with high blood triglycerides may also have high blood cholesterol levels. If you have high blood cholesterol as well as high blood triglycerides, your risk for heart disease is probably greater than if you only had high triglycerides. High blood cholesterol is one of the main risk factors for heart disease. CHANGING YOUR DIET  Your weight can affect your blood triglyceride level. If you are more than 20% above your ideal body weight, you may be able to lower your blood triglycerides by losing weight. Eating less and exercising regularly is the best way to combat this. Fat provides more  calories than any other food. The best way to lose weight is to eat less fat. Only 30% of your total calories should come from fat. Less than 7% of your diet should come from saturated fat. A diet low in fat and saturated fat is the same as a diet to decrease blood cholesterol. By eating a diet lower in fat, you may lose weight, lower your blood cholesterol, and lower your blood triglyceride level.  Eating a diet low in fat, especially saturated fat, may also help you lower your blood triglyceride level. Ask your dietitian to help you figure how much fat you can eat based on the number of calories your caregiver has prescribed for you.  Exercise, in addition to helping with weight loss may also help lower triglyceride levels.   Alcohol can increase blood triglycerides. You may need to stop drinking alcoholic beverages.  Too much carbohydrate in your diet may also increase your blood triglycerides. Some complex carbohydrates are necessary in your diet. These may include bread, rice, potatoes, other starchy vegetables and cereals.  Reduce "simple" carbohydrates. These may include pure sugars, candy, honey, and jelly without losing other nutrients. If you have the kind of high blood triglycerides that is affected by the amount of carbohydrates in your diet, you will need to eat less sugar and less high-sugar foods. Your caregiver can help you with this.  Adding 2-4 grams of fish oil (EPA+ DHA) may also help lower triglycerides. Speak with your caregiver before adding any supplements to your regimen. Following the Diet  Maintain your ideal weight. Your caregivers can help you with a diet. Generally, eating less food and getting more   exercise will help you lose weight. Joining a weight control group may also help. Ask your caregivers for a good weight control group in your area.  Eat low-fat foods instead of high-fat foods. This can help you lose weight too.  These foods are lower in fat. Eat MORE of these:    Dried beans, peas, and lentils.  Egg whites.  Low-fat cottage cheese.  Fish.  Lean cuts of meat, such as round, sirloin, rump, and flank (cut extra fat off meat you fix).  Whole grain breads, cereals and pasta.  Skim and nonfat dry milk.  Low-fat yogurt.  Poultry without the skin.  Cheese made with skim or part-skim milk, such as mozzarella, parmesan, farmers', ricotta, or pot cheese. These are higher fat foods. Eat LESS of these:   Whole milk and foods made from whole milk, such as American, blue, cheddar, monterey jack, and swiss cheese  High-fat meats, such as luncheon meats, sausages, knockwurst, bratwurst, hot dogs, ribs, corned beef, ground pork, and regular ground beef.  Fried foods. Limit saturated fats in your diet. Substituting unsaturated fat for saturated fat may decrease your blood triglyceride level. You will need to read package labels to know which products contain saturated fats.  These foods are high in saturated fat. Eat LESS of these:   Fried pork skins.  Whole milk.  Skin and fat from poultry.  Palm oil.  Butter.  Shortening.  Cream cheese.  Bacon.  Margarines and baked goods made from listed oils.  Vegetable shortenings.  Chitterlings.  Fat from meats.  Coconut oil.  Palm kernel oil.  Lard.  Cream.  Sour cream.  Fatback.  Coffee whiteners and non-dairy creamers made with these oils.  Cheese made from whole milk. Use unsaturated fats (both polyunsaturated and monounsaturated) moderately. Remember, even though unsaturated fats are better than saturated fats; you still want a diet low in total fat.  These foods are high in unsaturated fat:   Canola oil.  Sunflower oil.  Mayonnaise.  Almonds.  Peanuts.  Pine nuts.  Margarines made with these oils.  Safflower oil.  Olive oil.  Avocados.  Cashews.  Peanut butter.  Sunflower seeds.  Soybean oil.  Peanut  oil.  Olives.  Pecans.  Walnuts.  Pumpkin seeds. Avoid sugar and other high-sugar foods. This will decrease carbohydrates without decreasing other nutrients. Sugar in your food goes rapidly to your blood. When there is excess sugar in your blood, your liver may use it to make more triglycerides. Sugar also contains calories without other important nutrients.  Eat LESS of these:   Sugar, brown sugar, powdered sugar, jam, jelly, preserves, honey, syrup, molasses, pies, candy, cakes, cookies, frosting, pastries, colas, soft drinks, punches, fruit drinks, and regular gelatin.  Avoid alcohol. Alcohol, even more than sugar, may increase blood triglycerides. In addition, alcohol is high in calories and low in nutrients. Ask for sparkling water, or a diet soft drink instead of an alcoholic beverage. Suggestions for planning and preparing meals   Bake, broil, grill or roast meats instead of frying.  Remove fat from meats and skin from poultry before cooking.  Add spices, herbs, lemon juice or vinegar to vegetables instead of salt, rich sauces or gravies.  Use a non-stick skillet without fat or use no-stick sprays.  Cool and refrigerate stews and broth. Then remove the hardened fat floating on the surface before serving.  Refrigerate meat drippings and skim off fat to make low-fat gravies.  Serve more fish.  Use less butter,   margarine and other high-fat spreads on bread or vegetables.  Use skim or reconstituted non-fat dry milk for cooking.  Cook with low-fat cheeses.  Substitute low-fat yogurt or cottage cheese for all or part of the sour cream in recipes for sauces, dips or congealed salads.  Use half yogurt/half mayonnaise in salad recipes.  Substitute evaporated skim milk for cream. Evaporated skim milk or reconstituted non-fat dry milk can be whipped and substituted for whipped cream in certain recipes.  Choose fresh fruits for dessert instead of high-fat foods such as pies or  cakes. Fruits are naturally low in fat. When Dining Out   Order low-fat appetizers such as fruit or vegetable juice, pasta with vegetables or tomato sauce.  Select clear, rather than cream soups.  Ask that dressings and gravies be served on the side. Then use less of them.  Order foods that are baked, broiled, poached, steamed, stir-fried, or roasted.  Ask for margarine instead of butter, and use only a small amount.  Drink sparkling water, unsweetened tea or coffee, or diet soft drinks instead of alcohol or other sweet beverages. QUESTIONS AND ANSWERS ABOUT OTHER FATS IN THE BLOOD: SATURATED FAT, TRANS FAT, AND CHOLESTEROL What is trans fat? Trans fat is a type of fat that is formed when vegetable oil is hardened through a process called hydrogenation. This process helps makes foods more solid, gives them shape, and prolongs their shelf life. Trans fats are also called hydrogenated or partially hydrogenated oils.  What do saturated fat, trans fat, and cholesterol in foods have to do with heart disease? Saturated fat, trans fat, and cholesterol in the diet all raise the level of LDL "bad" cholesterol in the blood. The higher the LDL cholesterol, the greater the risk for coronary heart disease (CHD). Saturated fat and trans fat raise LDL similarly.  What foods contain saturated fat, trans fat, and cholesterol? High amounts of saturated fat are found in animal products, such as fatty cuts of meat, chicken skin, and full-fat dairy products like butter, whole milk, cream, and cheese, and in tropical vegetable oils such as palm, palm kernel, and coconut oil. Trans fat is found in some of the same foods as saturated fat, such as vegetable shortening, some margarines (especially hard or stick margarine), crackers, cookies, baked goods, fried foods, salad dressings, and other processed foods made with partially hydrogenated vegetable oils. Small amounts of trans fat also occur naturally in some animal  products, such as milk products, beef, and lamb. Foods high in cholesterol include liver, other organ meats, egg yolks, shrimp, and full-fat dairy products. How can I use the new food label to make heart-healthy food choices? Check the Nutrition Facts panel of the food label. Choose foods lower in saturated fat, trans fat, and cholesterol. For saturated fat and cholesterol, you can also use the Percent Daily Value (%DV): 5% DV or less is low, and 20% DV or more is high. (There is no %DV for trans fat.) Use the Nutrition Facts panel to choose foods low in saturated fat and cholesterol, and if the trans fat is not listed, read the ingredients and limit products that list shortening or hydrogenated or partially hydrogenated vegetable oil, which tend to be high in trans fat. POINTS TO REMEMBER:   Discuss your risk for heart disease with your caregivers, and take steps to reduce risk factors.  Change your diet. Choose foods that are low in saturated fat, trans fat, and cholesterol.  Add exercise to your daily routine if   it is not already being done. Participate in physical activity of moderate intensity, like brisk walking, for at least 30 minutes on most, and preferably all days of the week. No time? Break the 30 minutes into three, 10-minute segments during the day.  Stop smoking. If you do smoke, contact your caregiver to discuss ways in which they can help you quit.  Do not use street drugs.  Maintain a normal weight.  Maintain a healthy blood pressure.  Keep up with your blood work for checking the fats in your blood as directed by your caregiver. Document Released: 05/06/2004 Document Revised: 01/18/2012 Document Reviewed: 12/02/2008 [facility] Patient Information 2014 [place].  Try to lose some weight Establish more consistent exercise.

## 2013-09-14 NOTE — Progress Notes (Signed)
Pre visit review using our clinic review tool, if applicable. No additional management support is needed unless otherwise documented below in the visit note. 

## 2013-09-14 NOTE — Progress Notes (Signed)
Subjective:    Patient ID: Timothy Foster, male    DOB: 1985-08-06, 28 y.o.   MRN: [id number]  HPI Complete physical. Patient history of depression, dyslipidemia, GERD, possible IBS. Recent colonoscopy which was unremarkable. He has history of fatty liver changes per previous ultrasound and has had mildly elevated liver transaminases. No regular alcohol use. He's had previous hepatitis B vaccine series. His mother had hepatitis C but no specific risk factors for hepatitis C. He takes fish oil for hypertriglyceridemia. He's had previous triglycerides as high as 1500. Tetanus is up-to-date. No consistent exercise. Nonsmoker  Past Medical History  Diagnosis Date  . Chicken pox   . Depression   . Headache(784.0)   . Allergy   . Hyperlipidemia   . Anxiety   . GERD (gastroesophageal reflux disease)    Past Surgical History  Procedure Laterality Date  . Wisdom tooth extraction      reports that he has never smoked. He has never used smokeless tobacco. He reports that he drinks alcohol. He reports that he does not use illicit drugs. family history includes Alcohol abuse in his maternal uncle and paternal uncle; Cancer in his paternal uncle; Colon cancer in his paternal uncle; Crohn's disease in his maternal aunt; Diabetes in his father and mother; Esophageal cancer in his father; Pancreatic cancer in his paternal uncle; Stomach cancer in his paternal grandfather; Stroke in his father. Allergies  Allergen Reactions  . Penicillins     Hives as a child      Review of Systems  Constitutional: Negative for fever, activity change, appetite change and fatigue.  HENT: Negative for congestion, ear pain and trouble swallowing.   Eyes: Negative for pain and visual disturbance.  Respiratory: Negative for cough, shortness of breath and wheezing.   Cardiovascular: Negative for chest pain and palpitations.  Gastrointestinal: Negative for nausea, vomiting, abdominal pain, diarrhea, constipation, blood  in stool, abdominal distention and rectal pain.  Endocrine: Negative for polydipsia and polyuria.  Genitourinary: Negative for dysuria, hematuria and testicular pain.  Musculoskeletal: Negative for arthralgias and joint swelling.  Skin: Negative for rash.  Neurological: Negative for dizziness, syncope and headaches.  Hematological: Negative for adenopathy.  Psychiatric/Behavioral: Negative for confusion and dysphoric mood.       Objective:   Physical Exam  Constitutional: He is oriented to person, place, and time. He appears well-developed and well-nourished. No distress.  HENT:  Head: Normocephalic and atraumatic.  Right Ear: External ear normal.  Left Ear: External ear normal.  Mouth/Throat: Oropharynx is clear and moist.  Eyes: Conjunctivae and EOM are normal. Pupils are equal, round, and reactive to light.  Neck: Normal range of motion. Neck supple. No thyromegaly present.  Cardiovascular: Normal rate, regular rhythm and normal heart sounds.   No murmur heard. Pulmonary/Chest: No respiratory distress. He has no wheezes. He has no rales.  Abdominal: Soft. Bowel sounds are normal. He exhibits no distension and no mass. There is no tenderness. There is no rebound and no guarding.  Musculoskeletal: He exhibits no edema.  Lymphadenopathy:    He has no cervical adenopathy.  Neurological: He is alert and oriented to person, place, and time. He displays normal reflexes. No cranial nerve deficit.  Skin: No rash noted.  Patient has multiple nevi but no concerning abnormal moles  Psychiatric: He has a normal mood and affect.          Assessment & Plan:  Complete physical. Labs reviewed. He has mildly elevated liver transaminases. Elevated triglycerides over  600. We discussed lifestyle changes and especially need to lose some weight. Handout on hypertriglyceridemia given. He has known fatty liver changes. Recheck hepatic panel in 3 months. Continue omega-3 supplementation. Consider  fenofibrate if triglycerides not improving at followup

## 2013-12-12 ENCOUNTER — Other Ambulatory Visit: Payer: BC Managed Care – PPO

## 2013-12-12 ENCOUNTER — Other Ambulatory Visit: Payer: BC Managed Care – PPO | Admitting: Family Medicine

## 2014-02-06 ENCOUNTER — Encounter: Payer: Self-pay | Admitting: Family Medicine

## 2014-02-06 ENCOUNTER — Ambulatory Visit (INDEPENDENT_AMBULATORY_CARE_PROVIDER_SITE_OTHER): Payer: BC Managed Care – PPO | Admitting: Family Medicine

## 2014-02-06 VITALS — BP 124/80 | HR 87 | Temp 98.0°F | Wt 203.0 lb

## 2014-02-06 DIAGNOSIS — J018 Other acute sinusitis: Secondary | ICD-10-CM

## 2014-02-06 MED ORDER — CEFUROXIME AXETIL 500 MG PO TABS: 500.0000 mg | ORAL_TABLET | Freq: Two times a day (BID) | ORAL | Status: DC

## 2014-02-06 NOTE — Patient Instructions (Signed)
Sinusitis Sinusitis is redness, soreness, and swelling (inflammation) of the paranasal sinuses. Paranasal sinuses are air pockets within the bones of your face (beneath the eyes, the middle of the forehead, or above the eyes). In healthy paranasal sinuses, mucus is able to drain out, and air is able to circulate through them by way of your nose. However, when your paranasal sinuses are inflamed, mucus and air can become trapped. This can allow bacteria and other germs to grow and cause infection. Sinusitis can develop quickly and last only a short time (acute) or continue over a long period (chronic). Sinusitis that lasts for more than 12 weeks is considered chronic.  CAUSES  Causes of sinusitis include:  Allergies.  Structural abnormalities, such as displacement of the cartilage that separates your nostrils (deviated septum), which can decrease the air flow through your nose and sinuses and affect sinus drainage.  Functional abnormalities, such as when the small hairs (cilia) that line your sinuses and help remove mucus do not work properly or are not present. SYMPTOMS  Symptoms of acute and chronic sinusitis are the same. The primary symptoms are pain and pressure around the affected sinuses. Other symptoms include:  Upper toothache.  Earache.  Headache.  Bad breath.  Decreased sense of smell and taste.  A cough, which worsens when you are lying flat.  Fatigue.  Fever.  Thick drainage from your nose, which often is green and may contain pus (purulent).  Swelling and warmth over the affected sinuses. DIAGNOSIS  Your caregiver will perform a physical exam. During the exam, your caregiver may:  Look in your nose for signs of abnormal growths in your nostrils (nasal polyps).  Tap over the affected sinus to check for signs of infection.  View the inside of your sinuses (endoscopy) with a special imaging device with a light attached (endoscope), which is inserted into your  sinuses. If your caregiver suspects that you have chronic sinusitis, one or more of the following tests may be recommended:  Allergy tests.  Nasal culture--A sample of mucus is taken from your nose and sent to a lab and screened for bacteria.  Nasal cytology--A sample of mucus is taken from your nose and examined by your caregiver to determine if your sinusitis is related to an allergy. TREATMENT  Most cases of acute sinusitis are related to a viral infection and will resolve on their own within 10 days. Sometimes medicines are prescribed to help relieve symptoms (pain medicine, decongestants, nasal steroid sprays, or saline sprays).  However, for sinusitis related to a bacterial infection, your caregiver will prescribe antibiotic medicines. These are medicines that will help kill the bacteria causing the infection.  Rarely, sinusitis is caused by a fungal infection. In theses cases, your caregiver will prescribe antifungal medicine. For some cases of chronic sinusitis, surgery is needed. Generally, these are cases in which sinusitis recurs more than 3 times per year, despite other treatments. HOME CARE INSTRUCTIONS   Drink plenty of water. Water helps thin the mucus so your sinuses can drain more easily.  Use a humidifier.  Inhale steam 3 to 4 times a day (for example, sit in the bathroom with the shower running).  Apply a warm, moist washcloth to your face 3 to 4 times a day, or as directed by your caregiver.  Use saline nasal sprays to help moisten and clean your sinuses.  Take over-the-counter or prescription medicines for pain, discomfort, or fever only as directed by your caregiver. SEEK IMMEDIATE MEDICAL CARE IF:    You have increasing pain or severe headaches.  You have nausea, vomiting, or drowsiness.  You have swelling around your face.  You have vision problems.  You have a stiff neck.  You have difficulty breathing. MAKE SURE YOU:   Understand these  instructions.  Will watch your condition.  Will get help right away if you are not doing well or get worse. Document Released: 07/19/2005 Document Revised: 10/11/2011 Document Reviewed: 08/03/2011 ExitCare Patient Information 2015 [employer]. This information is not intended to replace advice given to you by your health care provider. Make sure you discuss any questions you have with your health care provider.  

## 2014-02-06 NOTE — Progress Notes (Signed)
Pre visit review using our clinic review tool, if applicable. No additional management support is needed unless otherwise documented below in the visit note. 

## 2014-02-06 NOTE — Progress Notes (Signed)
   Subjective:    Patient ID: Timothy Foster, male    DOB: [birth year], 28 y.o.   MRN: [id number]  Sinusitis Associated symptoms include congestion, coughing and sinus pressure. Pertinent negatives include no chills.   Patient seen with one week history of facial pain- mostly frontal sinus region bilaterally. He had some productive cough and thick yellow to green nasal discharge. Intermittent mild headaches. Mostly dry cough. Increased malaise. He works as a Catering manager. Symptoms are worse with lying. He's taking decongestant with minimal relief. Nonsmoker   Review of Systems  Constitutional: Negative for fever and chills.  HENT: Positive for congestion and sinus pressure. Negative for voice change.   Respiratory: Positive for cough.        Objective:   Physical Exam  Constitutional: He appears well-developed and well-nourished. No distress.  HENT:  Right Ear: External ear normal.  Left Ear: External ear normal.  Mouth/Throat: Oropharynx is clear and moist.  Turbinates are swollen with mild erythema  Neck: Neck supple.  Cardiovascular: Normal rate and regular rhythm.   Pulmonary/Chest: Effort normal and breath sounds normal. No respiratory distress. He has no wheezes. He has no rales.  Lymphadenopathy:    He has no cervical adenopathy.          Assessment & Plan:  Acute sinusitis. We explained most of these are viral. Observation for now. If symptoms progress, start Ceftin 500 mg twice daily for 10 days

## 2014-04-29 ENCOUNTER — Ambulatory Visit (INDEPENDENT_AMBULATORY_CARE_PROVIDER_SITE_OTHER): Payer: BC Managed Care – PPO

## 2014-04-29 DIAGNOSIS — Z23 Encounter for immunization: Secondary | ICD-10-CM

## 2014-06-06 ENCOUNTER — Telehealth: Payer: Self-pay | Admitting: Family Medicine

## 2014-06-06 ENCOUNTER — Other Ambulatory Visit: Payer: Self-pay | Admitting: Family Medicine

## 2014-06-06 MED ORDER — OMEPRAZOLE 40 MG PO CPDR: 40.0000 mg | DELAYED_RELEASE_CAPSULE | Freq: Every day | ORAL | Status: DC

## 2014-06-06 NOTE — Telephone Encounter (Signed)
Rx sent to pharmacy   

## 2014-06-06 NOTE — Telephone Encounter (Signed)
[facility] is requesting re-fill on omeprazole (PRILOSEC) 40 MG capsule

## 2014-07-21 ENCOUNTER — Telehealth: Payer: 59 | Admitting: Family

## 2014-07-21 DIAGNOSIS — J019 Acute sinusitis, unspecified: Secondary | ICD-10-CM

## 2014-07-21 MED ORDER — AZITHROMYCIN 250 MG PO TABS: ORAL_TABLET | ORAL | Status: DC

## 2014-07-21 NOTE — Progress Notes (Signed)
We are sorry that you are not feeling well.  Here is how we plan to help!  Based on what you have shared with me it looks like you have sinusitis.  Sinusitis is inflammation and infection in the sinus cavities of the head.  Based on your presentation I believe you most likely have Acute Bacterial sinusitis.  This is an infection caused by bacteria and is treated with antibiotics.   If your symptoms do not improve with your treatment plan it is important that you contact your provider.   I have prescribed Azithromyin 250 mg: two tables now and then one tablet daily for 4 additonal daysYou may use an oral decongestant such as Mucinex D or if you have glaucoma or high blood pressure use plain Mucinex.  Saline nasal sprays help an can sefely be used as often as needed for congestion.  If you develop worsening sinus pain, fever or notice severe headache and vision changes, or if symptoms are not better after completion of antibiotic, please schedule an appointment with a health care provider.  Sinus infections are not as easily transmitted as other respiratory infection, however we still recommend that you avoid close contact with loved ones, especially the very young and elderly.  Remember to wash your hands thoroughly throughout the day as this is the number one way to prevent the spread of infection!  Home Care:  Only take medications as instructed by your medical team.  Complete the entire course of an antibiotic.  Do not take these medications with alcohol.  A steam or ultrasonic humidifier can help congestion.  You can place a towel over your head and breathe in the steam from hot water coming from a faucet.  Avoid close contacts especially the very young and the elderly.  Cover your mouth when you cough or sneeze.  Always remember to wash your hands.  Get Help Right Away If:  You develop worsening fever or sinus pain.  You develop a severe head ache or visual changes.  Your symptoms  persist after you have completed your treatment plan.  Make sure you  Understand these instructions.  Will watch your condition.  Will get help right away if you are not doing well or get worse.  Your e-visit answers were reviewed by a board certified advanced clinical practitioner to complete your personal care plan.  Depending on the condition, your plan could have included both over the counter or prescription medications.  Please review your pharmacy choice.  If there is a problem, you may call our nursing hot line at [phone] and have the prescription routed to another pharmacy.  Your safety is important to [place].  If you have drug allergies check your prescription carefully.    You can use MyChart to ask questions about today's visit, request a non-urgent call back, or ask for a work or school excuse.  You will get an e-mail in the next two days asking about your experience.  I hope that your e-visit has been valuable and will speed your recovery. Thank you for using e-visits.

## 2014-10-16 ENCOUNTER — Encounter: Payer: Self-pay | Admitting: Family Medicine

## 2014-10-16 ENCOUNTER — Ambulatory Visit (INDEPENDENT_AMBULATORY_CARE_PROVIDER_SITE_OTHER): Payer: BLUE CROSS/BLUE SHIELD | Admitting: Family Medicine

## 2014-10-16 VITALS — BP 130/80 | HR 93 | Temp 98.2°F | Wt 209.0 lb

## 2014-10-16 DIAGNOSIS — E785 Hyperlipidemia, unspecified: Secondary | ICD-10-CM

## 2014-10-16 DIAGNOSIS — G4700 Insomnia, unspecified: Secondary | ICD-10-CM

## 2014-10-16 DIAGNOSIS — F5104 Psychophysiologic insomnia: Secondary | ICD-10-CM | POA: Insufficient documentation

## 2014-10-16 DIAGNOSIS — R197 Diarrhea, unspecified: Secondary | ICD-10-CM

## 2014-10-16 DIAGNOSIS — J3089 Other allergic rhinitis: Secondary | ICD-10-CM

## 2014-10-16 LAB — LIPID PANEL
Cholesterol: 218 mg/dL — ABNORMAL HIGH (ref 0–200)
HDL: 23.1 mg/dL — ABNORMAL LOW (ref >39.00)
Total CHOL/HDL Ratio: 9
Triglycerides: 2292 mg/dL — ABNORMAL HIGH (ref 0.0–149.0)

## 2014-10-16 LAB — HEPATIC FUNCTION PANEL
ALT: 67 U/L — ABNORMAL HIGH (ref 0–53)
AST: 51 U/L — ABNORMAL HIGH (ref 0–37)
Albumin: 5 g/dL (ref 3.5–5.2)
Alkaline Phosphatase: 95 U/L (ref 39–117)
Bilirubin, Direct: 0.1 mg/dL (ref 0.0–0.3)
Total Bilirubin: 0.4 mg/dL (ref 0.2–1.2)
Total Protein: 7 g/dL (ref 6.0–8.3)

## 2014-10-16 LAB — LDL CHOLESTEROL, DIRECT: Direct LDL: 57 mg/dL

## 2014-10-16 MED ORDER — DIPHENOXYLATE-ATROPINE 2.5-0.025 MG PO TABS: 1.0000 | ORAL_TABLET | Freq: Four times a day (QID) | ORAL | Status: DC | PRN

## 2014-10-16 MED ORDER — TRAZODONE HCL 50 MG PO TABS: 50.0000 mg | ORAL_TABLET | Freq: Every evening | ORAL | Status: DC | PRN

## 2014-10-16 NOTE — Patient Instructions (Signed)
Insomnia Insomnia is frequent trouble falling and/or staying asleep. Insomnia can be a long term problem or a short term problem. Both are common. Insomnia can be a short term problem when the wakefulness is related to a certain stress or worry. Long term insomnia is often related to ongoing stress during waking hours and/or poor sleeping habits. Overtime, sleep deprivation itself can make the problem worse. Every little thing feels more severe because you are overtired and your ability to cope is decreased. CAUSES   Stress, anxiety, and depression.  Poor sleeping habits.  Distractions such as TV in the bedroom.  Naps close to bedtime.  Engaging in emotionally charged conversations before bed.  Technical reading before sleep.  Alcohol and other sedatives. They may make the problem worse. They can hurt normal sleep patterns and normal dream activity.  Stimulants such as caffeine for several hours prior to bedtime.  Pain syndromes and shortness of breath can cause insomnia.  Exercise late at night.  Changing time zones may cause sleeping problems (jet lag). It is sometimes helpful to have someone observe your sleeping patterns. They should look for periods of not breathing during the night (sleep apnea). They should also look to see how long those periods last. If you live alone or observers are uncertain, you can also be observed at a sleep clinic where your sleep patterns will be professionally monitored. Sleep apnea requires a checkup and treatment. Give your caregivers your medical history. Give your caregivers observations your family has made about your sleep.  SYMPTOMS   Not feeling rested in the morning.  Anxiety and restlessness at bedtime.  Difficulty falling and staying asleep. TREATMENT   Your caregiver may prescribe treatment for an underlying medical disorders. Your caregiver can give advice or help if you are using alcohol or other drugs for self-medication. Treatment  of underlying problems will usually eliminate insomnia problems.  Medications can be prescribed for short time use. They are generally not recommended for lengthy use.  Over-the-counter sleep medicines are not recommended for lengthy use. They can be habit forming.  You can promote easier sleeping by making lifestyle changes such as:  Using relaxation techniques that help with breathing and reduce muscle tension.  Exercising earlier in the day.  Changing your diet and the time of your last meal. No night time snacks.  Establish a regular time to go to bed.  Counseling can help with stressful problems and worry.  Soothing music and white noise may be helpful if there are background noises you cannot remove.  Stop tedious detailed work at least one hour before bedtime. HOME CARE INSTRUCTIONS   Keep a diary. Inform your caregiver about your progress. This includes any medication side effects. See your caregiver regularly. Take note of:  Times when you are asleep.  Times when you are awake during the night.  The quality of your sleep.  How you feel the next day. This information will help your caregiver care for you.  Get out of bed if you are still awake after 15 minutes. Read or do some quiet activity. Keep the lights down. Wait until you feel sleepy and go back to bed.  Keep regular sleeping and waking hours. Avoid naps.  Exercise regularly.  Avoid distractions at bedtime. Distractions include watching television or engaging in any intense or detailed activity like attempting to balance the household checkbook.  Develop a bedtime ritual. Keep a familiar routine of bathing, brushing your teeth, climbing into bed at the same   time each night, listening to soothing music. Routines increase the success of falling to sleep faster.  Use relaxation techniques. This can be using breathing and muscle tension release routines. It can also include visualizing peaceful scenes. You can  also help control troubling or intruding thoughts by keeping your mind occupied with boring or repetitive thoughts like the old concept of counting sheep. You can make it more creative like imagining planting one beautiful flower after another in your backyard garden.  During your day, work to eliminate stress. When this is not possible use some of the previous suggestions to help reduce the anxiety that accompanies stressful situations. MAKE SURE YOU:   Understand these instructions.  Will watch your condition.  Will get help right away if you are not doing well or get worse. Document Released: 07/16/2000 Document Revised: 10/11/2011 Document Reviewed: 08/16/2007 ExitCare Patient Information 2015 [employer]. This information is not intended to replace advice given to you by your health care provider. Make sure you discuss any questions you have with your health care provider.  

## 2014-10-16 NOTE — Progress Notes (Signed)
   Subjective:    Patient ID: Timothy Foster, male    DOB: 06-28-1986, 29 y.o.   MRN: [id number]  HPI  Patient initially scheduled for complete physical. He has decided he would rather address some acute and chronic issues.  He has insomnia which has been somewhat chronic. Difficulty falling asleep. He attributes a lot of this to his work schedule. 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 He has difficulty falling asleep. Has tried Benadryl and melatonin but had hangover drowsiness. No caffeine use. No alcohol use. Denies any new specific stressors. Denies depressive symptoms. Took Lunesta previously which helped  History of dyslipidemia with high triglycerides. Currently not taking medications. Also past history of mild liver transaminase elevations.  Rare ETOH use.  History of chronic intermittent diarrhea. Present for several years. No appetite or weight changes. No bloody stools. No abdominal pain. Has used Imodium but had constipation afterwards. Cannot attribute to specific foods. He questions IBS history. Requesting FMLA papers to be filled out. Occasionally has to miss flights because of his diarrhea situation. He had colonoscopy just last year  Past Medical History  Diagnosis Date  . Chicken pox   . Depression   . Headache(784.0)   . Allergy   . Hyperlipidemia   . Anxiety   . GERD (gastroesophageal reflux disease)    Past Surgical History  Procedure Laterality Date  . Wisdom tooth extraction      reports that he has never smoked. He has never used smokeless tobacco. He reports that he drinks alcohol. He reports that he does not use illicit drugs. family history includes Alcohol abuse in his maternal uncle and paternal uncle; Cancer in his paternal uncle; Colon cancer in his paternal uncle; Crohn's disease in his maternal aunt; Diabetes in his father and mother; Esophageal cancer in his father; Pancreatic cancer in his paternal uncle; Stomach cancer in his  paternal grandfather; Stroke in his father. Allergies  Allergen Reactions  . Penicillins     Hives as a child     Review of Systems  Constitutional: Negative for appetite change, fatigue and unexpected weight change.  Eyes: Negative for visual disturbance.  Respiratory: Negative for cough, chest tightness and shortness of breath.   Cardiovascular: Negative for chest pain, palpitations and leg swelling.  Gastrointestinal: Positive for diarrhea. Negative for nausea, vomiting and abdominal pain.  Endocrine: Negative for polydipsia and polyuria.  Neurological: Negative for dizziness, syncope, weakness, light-headedness and headaches.       Objective:   Physical Exam  Constitutional: He appears well-developed and well-nourished.  HENT:  Mouth/Throat: Oropharynx is clear and moist.  Neck: Neck supple. No thyromegaly present.  Cardiovascular: Normal rate and regular rhythm.   Pulmonary/Chest: Effort normal and breath sounds normal. No respiratory distress. He has no wheezes. He has no rales.  Abdominal: Soft. Bowel sounds are normal. He exhibits no distension and no mass. There is no tenderness. There is no rebound and no guarding.  Musculoskeletal: He exhibits no edema.          Assessment & Plan:  #1 insomnia-chronic. Sleep hygiene discussed with handout given. Has not done well with Benadryl and melatonin. Try trazodone 50 mg daily at bedtime when necessary #2 chronic intermittent diarrhea. Question IBS. Look for triggers. Trial of Lomotil 1 every 8 hours as needed for severe diarrhea #30 with no refill #3 history of dyslipidemia with high triglycerides. Repeat fasting lipid panel and hepatic panel

## 2014-10-16 NOTE — Progress Notes (Signed)
Pre visit review using our clinic review tool, if applicable. No additional management support is needed unless otherwise documented below in the visit note. 

## 2014-10-17 ENCOUNTER — Other Ambulatory Visit: Payer: Self-pay

## 2014-10-17 ENCOUNTER — Other Ambulatory Visit: Payer: Self-pay | Admitting: Family Medicine

## 2014-10-17 DIAGNOSIS — E785 Hyperlipidemia, unspecified: Secondary | ICD-10-CM

## 2014-10-17 MED ORDER — FENOFIBRATE 160 MG PO TABS: 160.0000 mg | ORAL_TABLET | Freq: Every day | ORAL | Status: DC

## 2015-01-13 ENCOUNTER — Other Ambulatory Visit: Payer: Self-pay | Admitting: Family Medicine

## 2015-01-13 MED ORDER — VENLAFAXINE HCL ER 150 MG PO CP24: 150.0000 mg | ORAL_CAPSULE | Freq: Every day | ORAL | Status: DC

## 2015-01-13 NOTE — Telephone Encounter (Signed)
Pt needs a month of his Effexor 150MG  called to [facility] on [address] as he is going out of town and he is out. Also needs it called in to his mail order pharmacy, [place].

## 2015-01-13 NOTE — Telephone Encounter (Signed)
Rx sent 

## 2015-01-14 ENCOUNTER — Other Ambulatory Visit: Payer: Self-pay | Admitting: Family Medicine

## 2015-03-13 ENCOUNTER — Other Ambulatory Visit: Payer: Self-pay | Admitting: Family Medicine

## 2015-05-07 ENCOUNTER — Ambulatory Visit (INDEPENDENT_AMBULATORY_CARE_PROVIDER_SITE_OTHER): Payer: BLUE CROSS/BLUE SHIELD

## 2015-05-07 DIAGNOSIS — Z23 Encounter for immunization: Secondary | ICD-10-CM

## 2015-05-28 ENCOUNTER — Encounter: Payer: Self-pay | Admitting: Family Medicine

## 2015-05-28 ENCOUNTER — Other Ambulatory Visit (INDEPENDENT_AMBULATORY_CARE_PROVIDER_SITE_OTHER): Payer: BLUE CROSS/BLUE SHIELD

## 2015-05-28 DIAGNOSIS — E785 Hyperlipidemia, unspecified: Secondary | ICD-10-CM | POA: Diagnosis not present

## 2015-05-28 LAB — HEPATIC FUNCTION PANEL
ALT: 45 U/L (ref 0–53)
AST: 26 U/L (ref 0–37)
Albumin: 4.7 g/dL (ref 3.5–5.2)
Alkaline Phosphatase: 60 U/L (ref 39–117)
Bilirubin, Direct: 0.1 mg/dL (ref 0.0–0.3)
Total Bilirubin: 0.7 mg/dL (ref 0.2–1.2)
Total Protein: 7.4 g/dL (ref 6.0–8.3)

## 2015-05-28 LAB — LDL CHOLESTEROL, DIRECT: Direct LDL: 111 mg/dL

## 2015-05-28 LAB — LIPID PANEL
Cholesterol: 174 mg/dL (ref 0–200)
HDL: 24.4 mg/dL — ABNORMAL LOW (ref >39.00)
NonHDL: 149.69
Total CHOL/HDL Ratio: 7
Triglycerides: 320 mg/dL — ABNORMAL HIGH (ref 0.0–149.0)
VLDL: 64 mg/dL — ABNORMAL HIGH (ref 0.0–40.0)

## 2015-05-30 ENCOUNTER — Ambulatory Visit (INDEPENDENT_AMBULATORY_CARE_PROVIDER_SITE_OTHER): Payer: BLUE CROSS/BLUE SHIELD | Admitting: Family Medicine

## 2015-05-30 ENCOUNTER — Encounter: Payer: Self-pay | Admitting: Family Medicine

## 2015-05-30 VITALS — BP 118/70 | HR 98 | Temp 97.8°F | Wt 208.6 lb

## 2015-05-30 DIAGNOSIS — K648 Other hemorrhoids: Secondary | ICD-10-CM

## 2015-05-30 DIAGNOSIS — K219 Gastro-esophageal reflux disease without esophagitis: Secondary | ICD-10-CM

## 2015-05-30 DIAGNOSIS — E785 Hyperlipidemia, unspecified: Secondary | ICD-10-CM | POA: Diagnosis not present

## 2015-05-30 DIAGNOSIS — K644 Residual hemorrhoidal skin tags: Secondary | ICD-10-CM

## 2015-05-30 NOTE — Progress Notes (Signed)
   Subjective:    Patient ID: Timothy Foster, male    DOB: 05-20-1986, 29 y.o.   MRN: [id number]  HPI Follow-up regarding dyslipidemia and recent hemorrhoid  History of extremely high triglycerides. He had triglycerides over 2200 and we started fenofibrate which he is taking without side effect. Recent triglycerides 300 range. He is not taking Lovaza but plans to start over-the-counter omega-3 supplement soon. He is aware of dietary factors that can trigger high triglycerides. No history of pancreatitis. No consistent exercise.  External hemorrhoid. Noted recently. Occasional constipation. Rare bleeding. No associated pain. Has not tried anything topically for this. Minimally symptomatic.   GERD which is controlled with omeprazole. No recent dysphagia. He has history of chronic anxiety and recurrent depression which is stable on Effexor  Past Medical History  Diagnosis Date  . Chicken pox   . Depression   . Headache(784.0)   . Allergy   . Hyperlipidemia   . Anxiety   . GERD (gastroesophageal reflux disease)    Past Surgical History  Procedure Laterality Date  . Wisdom tooth extraction      reports that he has never smoked. He has never used smokeless tobacco. He reports that he drinks alcohol. He reports that he does not use illicit drugs. family history includes Alcohol abuse in his maternal uncle and paternal uncle; Cancer in his paternal uncle; Colon cancer in his paternal uncle; Crohn's disease in his maternal aunt; Diabetes in his father and mother; Esophageal cancer in his father; Pancreatic cancer in his paternal uncle; Stomach cancer in his paternal grandfather; Stroke in his father. Allergies  Allergen Reactions  . Penicillins     Hives as a child     Review of Systems  Constitutional: Negative for fatigue.  Eyes: Negative for visual disturbance.  Respiratory: Negative for cough, chest tightness and shortness of breath.   Cardiovascular: Negative for chest pain,  palpitations and leg swelling.  Gastrointestinal: Positive for constipation. Negative for nausea, vomiting and blood in stool.  Neurological: Negative for dizziness, syncope, weakness, light-headedness and headaches.       Objective:   Physical Exam  Constitutional: He appears well-developed and well-nourished. No distress.  Cardiovascular: Normal rate and regular rhythm.   Pulmonary/Chest: Effort normal and breath sounds normal. No respiratory distress. He has no wheezes. He has no rales.  Abdominal: Soft. Bowel sounds are normal. He exhibits no distension and no mass. There is no tenderness. There is no rebound and no guarding.  Genitourinary:  Patient has nonthrombosed external hemorrhoid around 1:00 position. No bleeding. Nontender.  Musculoskeletal: He exhibits no edema.          Assessment & Plan:  #1 dyslipidemia. Continue fenofibrate. Triglycerides greatly improved. He will consider over-the-counter omega-3 supplement and diet high in omega-3 #2 nonthrombosed external hemorrhoid. Minimally symptomatic. Measures to reduce constipation. Sitz baths. He'll try over-the-counter cream such as Anusol HC or Preparation H #3 GERD. Stable on omeprazole.

## 2015-05-30 NOTE — Progress Notes (Signed)
Pre visit review using our clinic review tool, if applicable. No additional management support is needed unless otherwise documented below in the visit note. 

## 2015-05-30 NOTE — Patient Instructions (Signed)

## 2015-06-23 ENCOUNTER — Other Ambulatory Visit: Payer: Self-pay | Admitting: Family Medicine

## 2015-07-12 ENCOUNTER — Other Ambulatory Visit: Payer: Self-pay | Admitting: Family Medicine

## 2015-07-25 ENCOUNTER — Ambulatory Visit (INDEPENDENT_AMBULATORY_CARE_PROVIDER_SITE_OTHER): Payer: BLUE CROSS/BLUE SHIELD | Admitting: Family Medicine

## 2015-07-25 ENCOUNTER — Encounter: Payer: Self-pay | Admitting: Family Medicine

## 2015-07-25 VITALS — BP 120/88 | HR 116 | Temp 97.8°F | Wt 210.0 lb

## 2015-07-25 DIAGNOSIS — M79601 Pain in right arm: Secondary | ICD-10-CM | POA: Diagnosis not present

## 2015-07-25 NOTE — Progress Notes (Signed)
Pre visit review using our clinic review tool, if applicable. No additional management support is needed unless otherwise documented below in the visit note. 

## 2015-07-25 NOTE — Progress Notes (Signed)
   Subjective:    Patient ID: Timothy Foster, male    DOB: [birth year], 29 y.o.   MRN: [id number]  HPI  Patient seen with right arm and elbow pain for one month Pain is intermittent. He works as a Catering manager. Denies any specific injury Location of pain is posterior arm just proximal to the elbow and olecranon region. Has not noticed any swelling or warmth of the olecranon bursa. No pain with gripping. No history of medial or lateral epicondylitis. Has not tried any icing. Symptoms are actually improved today compared to last week  Past Medical History  Diagnosis Date  . Chicken pox   . Depression   . Headache(784.0)   . Allergy   . Hyperlipidemia   . Anxiety   . GERD (gastroesophageal reflux disease)    Past Surgical History  Procedure Laterality Date  . Wisdom tooth extraction      reports that he has never smoked. He has never used smokeless tobacco. He reports that he drinks alcohol. He reports that he does not use illicit drugs. family history includes Alcohol abuse in his maternal uncle and paternal uncle; Cancer in his paternal uncle; Colon cancer in his paternal uncle; Crohn's disease in his maternal aunt; Diabetes in his father and mother; Esophageal cancer in his father; Pancreatic cancer in his paternal uncle; Stomach cancer in his paternal grandfather; Stroke in his father. Allergies  Allergen Reactions  . Penicillins     Hives as a child     Review of Systems  Constitutional: Negative for fever and chills.  Hematological: Negative for adenopathy.       Objective:   Physical Exam  Constitutional: He appears well-developed and well-nourished. No distress.  Cardiovascular: Normal rate and regular rhythm.   Pulmonary/Chest: Effort normal and breath sounds normal. No respiratory distress. He has no wheezes. He has no rales.  Musculoskeletal:  Right elbow full range of motion. No olecranon swelling. No pain with suppuration or pronation. Minimal distal  triceps tenderness. No medial or lateral epicondylar tenderness. No pain with wrist extension or flexion against resistance. No distal biceps tenderness          Assessment & Plan:  Right arm pain. Question triceps strain. Clinically improving. Ice for any recurrent pain Over-the-counter anti-inflammatories as needed

## 2015-07-25 NOTE — Patient Instructions (Signed)
Try some icing to elbow for any recurrent pain.

## 2015-08-19 ENCOUNTER — Encounter: Payer: Self-pay | Admitting: Family Medicine

## 2015-08-20 NOTE — Telephone Encounter (Signed)
I don't see where pt has tried and failed any other cholesterol medications. Please advise.

## 2015-09-08 ENCOUNTER — Encounter: Payer: Self-pay | Admitting: Family Medicine

## 2015-09-08 ENCOUNTER — Other Ambulatory Visit: Payer: Self-pay | Admitting: Family Medicine

## 2015-09-08 MED ORDER — FENOFIBRATE 160 MG PO TABS: 160.0000 mg | ORAL_TABLET | Freq: Every day | ORAL | Status: DC

## 2015-09-08 NOTE — Progress Notes (Signed)
May refill fenofibrate for 90 days with 3 refills.

## 2015-11-11 DIAGNOSIS — J301 Allergic rhinitis due to pollen: Secondary | ICD-10-CM | POA: Diagnosis not present

## 2015-11-11 DIAGNOSIS — M9902 Segmental and somatic dysfunction of thoracic region: Secondary | ICD-10-CM | POA: Diagnosis not present

## 2015-11-11 DIAGNOSIS — J3089 Other allergic rhinitis: Secondary | ICD-10-CM | POA: Diagnosis not present

## 2015-11-11 DIAGNOSIS — M791 Myalgia: Secondary | ICD-10-CM | POA: Diagnosis not present

## 2015-11-11 DIAGNOSIS — M9901 Segmental and somatic dysfunction of cervical region: Secondary | ICD-10-CM | POA: Diagnosis not present

## 2015-11-11 DIAGNOSIS — M7542 Impingement syndrome of left shoulder: Secondary | ICD-10-CM | POA: Diagnosis not present

## 2015-11-21 ENCOUNTER — Ambulatory Visit (INDEPENDENT_AMBULATORY_CARE_PROVIDER_SITE_OTHER): Payer: BLUE CROSS/BLUE SHIELD | Admitting: Family Medicine

## 2015-11-21 ENCOUNTER — Encounter: Payer: Self-pay | Admitting: Family Medicine

## 2015-11-21 VITALS — BP 130/92 | HR 118 | Temp 98.0°F | Ht 72.0 in | Wt 213.0 lb

## 2015-11-21 DIAGNOSIS — R41840 Attention and concentration deficit: Secondary | ICD-10-CM | POA: Diagnosis not present

## 2015-11-21 DIAGNOSIS — K529 Noninfective gastroenteritis and colitis, unspecified: Secondary | ICD-10-CM

## 2015-11-21 DIAGNOSIS — J3089 Other allergic rhinitis: Secondary | ICD-10-CM | POA: Diagnosis not present

## 2015-11-21 DIAGNOSIS — J301 Allergic rhinitis due to pollen: Secondary | ICD-10-CM | POA: Diagnosis not present

## 2015-11-21 MED ORDER — DICYCLOMINE HCL 20 MG PO TABS
20.0000 mg | ORAL_TABLET | Freq: Four times a day (QID) | ORAL | Status: DC | PRN
Start: 2015-11-21 — End: 2016-09-01

## 2015-11-21 NOTE — Progress Notes (Signed)
   Subjective:    Patient ID: Timothy Foster, male    DOB: February 10, 1986, 30 y.o.   MRN: [id number]  HPI  Here to discuss several issues:  Intermittent diarrhea. He's had this frequently in the past. No appetite or weight changes. Had colonoscopy last year which was unremarkable. Sometimes goes 2-3 days with frequent loose stools. No clear consistent dietary triggers. No appetite or weight changes. No bloody stools. Frequently has associated abdominal cramps. He took Levsin in the past without much relief. No recent antibiotics.  Complains of some general fatigue. He feels he is having tremendous difficulty with attention and focusing, especially in the afternoons. He thinks his had some difficulty focusing for several years. Never tested for attention deficit disorder. He would like to pursue further testing.  History of recurrent depression currently stable on Effexor.  Past Medical History  Diagnosis Date  . Chicken pox   . Depression   . Headache(784.0)   . Allergy   . Hyperlipidemia   . Anxiety   . GERD (gastroesophageal reflux disease)    Past Surgical History  Procedure Laterality Date  . Wisdom tooth extraction      reports that he has never smoked. He has never used smokeless tobacco. He reports that he drinks alcohol. He reports that he does not use illicit drugs. family history includes Alcohol abuse in his maternal uncle and paternal uncle; Cancer in his paternal uncle; Colon cancer in his paternal uncle; Crohn's disease in his maternal aunt; Diabetes in his father and mother; Esophageal cancer in his father; Pancreatic cancer in his paternal uncle; Stomach cancer in his paternal grandfather; Stroke in his father. Allergies  Allergen Reactions  . Penicillins     Hives as a child     Review of Systems  Constitutional: Positive for fatigue. Negative for fever, chills, appetite change and unexpected weight change.  Respiratory: Negative for cough and shortness of breath.     Cardiovascular: Negative for chest pain.  Gastrointestinal: Positive for diarrhea. Negative for nausea, vomiting, constipation, blood in stool and abdominal distention.  Endocrine: Negative for polydipsia and polyuria.  Genitourinary: Negative for dysuria.       Objective:   Physical Exam  Constitutional: He appears well-developed and well-nourished.  Neck: Neck supple. No thyromegaly present.  Cardiovascular: Normal rate and regular rhythm.  Exam reveals no gallop.   No murmur heard. Pulmonary/Chest: Effort normal and breath sounds normal. No respiratory distress. He has no wheezes. He has no rales.  Abdominal: Soft. Bowel sounds are normal. He exhibits no distension and no mass. There is no tenderness. There is no rebound and no guarding.  Musculoskeletal: He exhibits no edema.          Assessment & Plan:  #1 intermittent diarrhea. Probable IBS. He's had these symptoms for several years and symptoms are very sporadic. He has not seen any clear correlation with foods (eg lactose or glutens). He's had previous GI evaluation with colonoscopy and lab work unrevealing. Trial of dicyclomine 20 mg one every 6 hours as needed for abdominal cramps. Encouraged to consume high-fiber diet  #2 concern for possible adult attention deficit disorder. We've recommended further testing with clinical psychologist and patient will set up

## 2015-11-21 NOTE — Progress Notes (Signed)
Pre visit review using our clinic review tool, if applicable. No additional management support is needed unless otherwise documented below in the visit note. 

## 2015-11-24 DIAGNOSIS — J301 Allergic rhinitis due to pollen: Secondary | ICD-10-CM | POA: Diagnosis not present

## 2015-11-24 DIAGNOSIS — J3089 Other allergic rhinitis: Secondary | ICD-10-CM | POA: Diagnosis not present

## 2015-11-26 DIAGNOSIS — J301 Allergic rhinitis due to pollen: Secondary | ICD-10-CM | POA: Diagnosis not present

## 2015-11-26 DIAGNOSIS — J3089 Other allergic rhinitis: Secondary | ICD-10-CM | POA: Diagnosis not present

## 2015-12-02 DIAGNOSIS — J301 Allergic rhinitis due to pollen: Secondary | ICD-10-CM | POA: Diagnosis not present

## 2015-12-02 DIAGNOSIS — Z91010 Allergy to peanuts: Secondary | ICD-10-CM | POA: Diagnosis not present

## 2015-12-02 DIAGNOSIS — J3089 Other allergic rhinitis: Secondary | ICD-10-CM | POA: Diagnosis not present

## 2015-12-02 DIAGNOSIS — K219 Gastro-esophageal reflux disease without esophagitis: Secondary | ICD-10-CM | POA: Diagnosis not present

## 2015-12-12 DIAGNOSIS — J3089 Other allergic rhinitis: Secondary | ICD-10-CM | POA: Diagnosis not present

## 2015-12-12 DIAGNOSIS — J301 Allergic rhinitis due to pollen: Secondary | ICD-10-CM | POA: Diagnosis not present

## 2015-12-16 DIAGNOSIS — J3089 Other allergic rhinitis: Secondary | ICD-10-CM | POA: Diagnosis not present

## 2015-12-16 DIAGNOSIS — J301 Allergic rhinitis due to pollen: Secondary | ICD-10-CM | POA: Diagnosis not present

## 2015-12-22 ENCOUNTER — Ambulatory Visit (INDEPENDENT_AMBULATORY_CARE_PROVIDER_SITE_OTHER): Payer: BLUE CROSS/BLUE SHIELD | Admitting: Psychology

## 2015-12-22 DIAGNOSIS — F3340 Major depressive disorder, recurrent, in remission, unspecified: Secondary | ICD-10-CM | POA: Diagnosis not present

## 2015-12-22 DIAGNOSIS — J3089 Other allergic rhinitis: Secondary | ICD-10-CM | POA: Diagnosis not present

## 2015-12-22 DIAGNOSIS — F902 Attention-deficit hyperactivity disorder, combined type: Secondary | ICD-10-CM

## 2015-12-22 DIAGNOSIS — J301 Allergic rhinitis due to pollen: Secondary | ICD-10-CM | POA: Diagnosis not present

## 2016-01-02 DIAGNOSIS — J301 Allergic rhinitis due to pollen: Secondary | ICD-10-CM | POA: Diagnosis not present

## 2016-01-02 DIAGNOSIS — J3089 Other allergic rhinitis: Secondary | ICD-10-CM | POA: Diagnosis not present

## 2016-01-09 ENCOUNTER — Ambulatory Visit (INDEPENDENT_AMBULATORY_CARE_PROVIDER_SITE_OTHER): Payer: BLUE CROSS/BLUE SHIELD | Admitting: Psychology

## 2016-01-09 DIAGNOSIS — G4700 Insomnia, unspecified: Secondary | ICD-10-CM | POA: Diagnosis not present

## 2016-01-09 DIAGNOSIS — F900 Attention-deficit hyperactivity disorder, predominantly inattentive type: Secondary | ICD-10-CM

## 2016-01-09 DIAGNOSIS — J301 Allergic rhinitis due to pollen: Secondary | ICD-10-CM | POA: Diagnosis not present

## 2016-01-09 DIAGNOSIS — J3089 Other allergic rhinitis: Secondary | ICD-10-CM | POA: Diagnosis not present

## 2016-01-12 ENCOUNTER — Telehealth: Payer: BLUE CROSS/BLUE SHIELD | Admitting: Nurse Practitioner

## 2016-01-12 ENCOUNTER — Encounter: Payer: Self-pay | Admitting: Family Medicine

## 2016-01-12 DIAGNOSIS — J0101 Acute recurrent maxillary sinusitis: Secondary | ICD-10-CM

## 2016-01-12 MED ORDER — FLUCONAZOLE 150 MG PO TABS: 150.0000 mg | ORAL_TABLET | Freq: Once | ORAL | Status: DC

## 2016-01-12 MED ORDER — AZITHROMYCIN 250 MG PO TABS: ORAL_TABLET | ORAL | Status: DC

## 2016-01-12 NOTE — Progress Notes (Signed)
We are sorry that you are not feeling well.  Here is how we plan to help! You will see treatment plan below- a out of work note should be in your my chart basket to print- i will  Not be able to sign but if employer has any questions they can call office.  Based on what you have shared with me it looks like you have sinusitis.  Sinusitis is inflammation and infection in the sinus cavities of the head.  Based on your presentation I believe you most likely have Acute Bacterial sinusitis.  This is an infection caused by bacteria and is treated with antibiotics.  I have prescribed z paK, an antibiotic in the macrolide family, to be taken as directed. I have also sent on rx for nasal spray ( Fluconazole ) 2 sprays in each nostril every morning. You may use an oral decongestant such as Mucinex D or if you have glaucoma or high blood pressure use plain Mucinex.  Saline nasal sprays help an can sefely be used as often as needed for congestion.  If you develop worsening sinus pain, fever or notice severe headache and vision changes, or if symptoms are not better after completion of antibiotic, please schedule an appointment with a health care provider.  Sinus infections are not as easily transmitted as other respiratory infection, however we still recommend that you avoid close contact with loved ones, especially the very young and elderly.  Remember to wash your hands thoroughly throughout the day as this is the number one way to prevent the spread of infection!  Home Care:  Only take medications as instructed by your medical team.  Complete the entire course of an antibiotic.  Do not take these medications with alcohol.  A steam or ultrasonic humidifier can help congestion.  You can place a towel over your head and breathe in the steam from hot water coming from a faucet.  Avoid close contacts especially the very young and the elderly.  Cover your mouth when you cough or sneeze.  Always remember to wash  your hands.  Get Help Right Away If:  You develop worsening fever or sinus pain.  You develop a severe head ache or visual changes.  Your symptoms persist after you have completed your treatment plan.  Make sure you  Understand these instructions.  Will watch your condition.  Will get help right away if you are not doing well or get worse.  Your e-visit answers were reviewed by a board certified advanced clinical practitioner to complete your personal care plan.  Depending on the condition, your plan could have included both over the counter or prescription medications.  If there is a problem please reply  once you have received a response from your provider.  Your safety is important to [place].  If you have drug allergies check your prescription carefully.    You can use MyChart to ask questions about today's visit, request a non-urgent call back, or ask for a work or school excuse.  You will get an e-mail in the next two days asking about your experience.  I hope that your e-visit has been valuable and will speed your recovery. Thank you for using e-visits.

## 2016-01-15 DIAGNOSIS — J3089 Other allergic rhinitis: Secondary | ICD-10-CM | POA: Diagnosis not present

## 2016-01-15 DIAGNOSIS — J301 Allergic rhinitis due to pollen: Secondary | ICD-10-CM | POA: Diagnosis not present

## 2016-01-27 ENCOUNTER — Ambulatory Visit (INDEPENDENT_AMBULATORY_CARE_PROVIDER_SITE_OTHER): Payer: BLUE CROSS/BLUE SHIELD | Admitting: Family Medicine

## 2016-01-27 ENCOUNTER — Encounter: Payer: Self-pay | Admitting: Family Medicine

## 2016-01-27 VITALS — BP 120/80 | HR 110 | Temp 98.0°F | Ht 72.0 in | Wt 205.0 lb

## 2016-01-27 DIAGNOSIS — J301 Allergic rhinitis due to pollen: Secondary | ICD-10-CM | POA: Diagnosis not present

## 2016-01-27 DIAGNOSIS — F988 Other specified behavioral and emotional disorders with onset usually occurring in childhood and adolescence: Secondary | ICD-10-CM | POA: Insufficient documentation

## 2016-01-27 DIAGNOSIS — F909 Attention-deficit hyperactivity disorder, unspecified type: Secondary | ICD-10-CM

## 2016-01-27 DIAGNOSIS — J3089 Other allergic rhinitis: Secondary | ICD-10-CM | POA: Diagnosis not present

## 2016-01-27 MED ORDER — AMPHETAMINE-DEXTROAMPHETAMINE 20 MG PO TABS: 20.0000 mg | ORAL_TABLET | Freq: Two times a day (BID) | ORAL | Status: DC

## 2016-01-27 NOTE — Progress Notes (Signed)
Pre visit review using our clinic review tool, if applicable. No additional management support is needed unless otherwise documented below in the visit note. 

## 2016-01-27 NOTE — Progress Notes (Signed)
   Subjective:    Patient ID: Timothy Foster, male    DOB: 11-02-1985, 30 y.o.   MRN: [id number]  HPI Patient seen to discuss attention deficit disorder. Recently had extensive evaluation per clinical psychologist with clear evidence of adult attention deficit. He thinks he had some problems with attention and focus most of his life. Even though he did fairly well in school he recalls having difficulty completing has been easy distraction. He has particularly noted in his adult life both at home and with work. His partner has particularly noted that he seems to be distant and not focused when they're having conversations  He's had some chronic sleep difficulties and recently has been trying to be more regimented with when he goes to bed when he gets up and this seems to help. past history of depression but feels those symptoms are stable. He remains on Effexor.  No history of illicit drug use. Occasional alcohol but no history of misuse. Nonsmoker.  Past Medical History  Diagnosis Date  . Chicken pox   . Depression   . Headache(784.0)   . Allergy   . Hyperlipidemia   . Anxiety   . GERD (gastroesophageal reflux disease)    Past Surgical History  Procedure Laterality Date  . Wisdom tooth extraction      reports that he has never smoked. He has never used smokeless tobacco. He reports that he drinks alcohol. He reports that he does not use illicit drugs. family history includes Alcohol abuse in his maternal uncle and paternal uncle; Cancer in his paternal uncle; Colon cancer in his paternal uncle; Crohn's disease in his maternal aunt; Diabetes in his father and mother; Esophageal cancer in his father; Pancreatic cancer in his paternal uncle; Stomach cancer in his paternal grandfather; Stroke in his father. Allergies  Allergen Reactions  . Penicillins     Hives as a child      Review of Systems  Constitutional: Negative for appetite change, fatigue and unexpected weight change.  Eyes:  Negative for visual disturbance.  Respiratory: Negative for cough, chest tightness and shortness of breath.   Cardiovascular: Negative for chest pain, palpitations and leg swelling.  Neurological: Negative for dizziness, syncope, weakness, light-headedness and headaches.  Psychiatric/Behavioral: Negative for dysphoric mood.       Objective:   Physical Exam  Constitutional: He is oriented to person, place, and time. He appears well-developed and well-nourished.  HENT:  Right Ear: External ear normal.  Left Ear: External ear normal.  Mouth/Throat: Oropharynx is clear and moist.  Eyes: Pupils are equal, round, and reactive to light.  Neck: Neck supple. No thyromegaly present.  Cardiovascular: Normal rate and regular rhythm.   Pulmonary/Chest: Effort normal and breath sounds normal. No respiratory distress. He has no wheezes. He has no rales.  Musculoskeletal: He exhibits no edema.  Neurological: He is alert and oriented to person, place, and time. No cranial nerve deficit.  Psychiatric: He has a normal mood and affect. His behavior is normal. Judgment and thought content normal.          Assessment & Plan:  Attention deficit disorder. Recent extensive psychological screen with recommendation to consider stimulant medication. We discussed various options. We recommended trial of Adderall 20 mg and he will start with one half tablet twice daily and if necessary titrate to one tablet twice daily. Reassess in one month. We reviewed possible side effects including insomnia and appetite suppression.  [name] MD [facility] at [facility]

## 2016-01-27 NOTE — Patient Instructions (Signed)

## 2016-02-02 ENCOUNTER — Telehealth: Payer: Self-pay | Admitting: Family Medicine

## 2016-02-02 MED ORDER — LORAZEPAM 0.5 MG PO TABS: 0.5000 mg | ORAL_TABLET | Freq: Two times a day (BID) | ORAL | Status: DC | PRN

## 2016-02-02 MED ORDER — LORAZEPAM 0.5 MG PO TABS: 0.5000 mg | ORAL_TABLET | Freq: Three times a day (TID) | ORAL | Status: DC | PRN

## 2016-02-02 NOTE — Telephone Encounter (Signed)
Last seen on 01-27-16 and put on Adderal

## 2016-02-02 NOTE — Telephone Encounter (Signed)
Very limited Lorazepam 0.5 mg one every 8 hours prn severe anxiety #10.

## 2016-02-02 NOTE — Telephone Encounter (Signed)
Pt's mother passed away suddenly this weekend and states he is not handling well and very upset.  Would like to know if Dr Elease Hashimoto would prescribe something mild for a few days to help get through the funeral  and this difficult time. Pt requested perhaps ativan? Whatever dr will do.  [facility]/ [address]

## 2016-02-02 NOTE — Telephone Encounter (Signed)
Called Rx in to pharmacy per Dr. Elease Hashimoto.

## 2016-02-13 DIAGNOSIS — J3089 Other allergic rhinitis: Secondary | ICD-10-CM | POA: Diagnosis not present

## 2016-02-13 DIAGNOSIS — J301 Allergic rhinitis due to pollen: Secondary | ICD-10-CM | POA: Diagnosis not present

## 2016-02-25 DIAGNOSIS — J3089 Other allergic rhinitis: Secondary | ICD-10-CM | POA: Diagnosis not present

## 2016-02-25 DIAGNOSIS — J301 Allergic rhinitis due to pollen: Secondary | ICD-10-CM | POA: Diagnosis not present

## 2016-02-27 ENCOUNTER — Encounter: Payer: Self-pay | Admitting: Family Medicine

## 2016-02-27 ENCOUNTER — Ambulatory Visit (INDEPENDENT_AMBULATORY_CARE_PROVIDER_SITE_OTHER): Payer: BLUE CROSS/BLUE SHIELD | Admitting: Family Medicine

## 2016-02-27 VITALS — BP 120/80 | HR 110 | Temp 97.9°F | Ht 72.0 in | Wt 208.0 lb

## 2016-02-27 DIAGNOSIS — F909 Attention-deficit hyperactivity disorder, unspecified type: Secondary | ICD-10-CM

## 2016-02-27 DIAGNOSIS — F988 Other specified behavioral and emotional disorders with onset usually occurring in childhood and adolescence: Secondary | ICD-10-CM

## 2016-02-27 MED ORDER — LORAZEPAM 0.5 MG PO TABS: 0.5000 mg | ORAL_TABLET | Freq: Three times a day (TID) | ORAL | 0 refills | Status: DC | PRN

## 2016-02-27 NOTE — Progress Notes (Signed)
Pre visit review using our clinic review tool, if applicable. No additional management support is needed unless otherwise documented below in the visit note. 

## 2016-02-27 NOTE — Progress Notes (Signed)
Subjective:     Patient ID: Timothy Foster, male   DOB: Jun 18, 1986, 30 y.o.   MRN: [id number]  HPI Here for follow up regarding ADD. We started Adderall 20 mg 1/2 tablet twice daily This dose appears to be working very well for him. No insomnia. No appetite suppression. He is able to focus much better. Completing tasks better. Less distraction.  His mom passed away suddenly about a month ago. He's had a lot of stress dealing with that. Overall, coping fairly well. Has had some anxiety symptoms and using infrequent lorazepam for severe anxiety. He describes what sounds almost like a panic attack with severe anxiety which usually lasts about 2 hours.  Past Medical History:  Diagnosis Date  . Allergy   . Anxiety   . Chicken pox   . Depression   . GERD (gastroesophageal reflux disease)   . Headache(784.0)   . Hyperlipidemia    Past Surgical History:  Procedure Laterality Date  . WISDOM TOOTH EXTRACTION      reports that he has never smoked. He has never used smokeless tobacco. He reports that he drinks alcohol. He reports that he does not use drugs. family history includes Alcohol abuse in his maternal uncle and paternal uncle; Cancer in his paternal uncle; Colon cancer in his paternal uncle; Crohn's disease in his maternal aunt; Diabetes in his father and mother; Esophageal cancer in his father; Pancreatic cancer in his paternal uncle; Stomach cancer in his paternal grandfather; Stroke in his father. Allergies  Allergen Reactions  . Penicillins     Hives as a child     Review of Systems  Constitutional: Negative for appetite change.  Respiratory: Negative for shortness of breath.   Cardiovascular: Negative for chest pain.  Psychiatric/Behavioral: Negative for sleep disturbance. The patient is nervous/anxious.        Objective:   Physical Exam  Constitutional: He is oriented to person, place, and time. He appears well-developed and well-nourished. No distress.  Neck: Neck supple.  No thyromegaly present.  Cardiovascular: Normal rate and regular rhythm.   Pulmonary/Chest: Effort normal and breath sounds normal. No respiratory distress. He has no wheezes. He has no rales.  Neurological: He is alert and oriented to person, place, and time. No cranial nerve deficit.  Psychiatric: He has a normal mood and affect. His behavior is normal.       Assessment:     Adult attention deficit disorder. Improved with Adderall    Plan:     Continue Adderall 20 mg one half to one tablet twice daily. He is not due for refills at this time. When refills are due, will refill for 3 months  [name] MD [facility]

## 2016-03-05 DIAGNOSIS — J3089 Other allergic rhinitis: Secondary | ICD-10-CM | POA: Diagnosis not present

## 2016-03-05 DIAGNOSIS — M9902 Segmental and somatic dysfunction of thoracic region: Secondary | ICD-10-CM | POA: Diagnosis not present

## 2016-03-05 DIAGNOSIS — M9901 Segmental and somatic dysfunction of cervical region: Secondary | ICD-10-CM | POA: Diagnosis not present

## 2016-03-05 DIAGNOSIS — J301 Allergic rhinitis due to pollen: Secondary | ICD-10-CM | POA: Diagnosis not present

## 2016-03-05 DIAGNOSIS — M7542 Impingement syndrome of left shoulder: Secondary | ICD-10-CM | POA: Diagnosis not present

## 2016-03-05 DIAGNOSIS — M791 Myalgia: Secondary | ICD-10-CM | POA: Diagnosis not present

## 2016-03-10 ENCOUNTER — Other Ambulatory Visit: Payer: Self-pay | Admitting: Family Medicine

## 2016-03-10 MED ORDER — FEXOFENADINE HCL 180 MG PO TABS: 180.0000 mg | ORAL_TABLET | Freq: Every day | ORAL | 1 refills | Status: AC

## 2016-03-15 ENCOUNTER — Encounter: Payer: Self-pay | Admitting: Family Medicine

## 2016-03-15 ENCOUNTER — Ambulatory Visit (INDEPENDENT_AMBULATORY_CARE_PROVIDER_SITE_OTHER): Payer: BLUE CROSS/BLUE SHIELD | Admitting: Family Medicine

## 2016-03-15 DIAGNOSIS — F4323 Adjustment disorder with mixed anxiety and depressed mood: Secondary | ICD-10-CM | POA: Diagnosis not present

## 2016-03-15 DIAGNOSIS — F411 Generalized anxiety disorder: Secondary | ICD-10-CM | POA: Diagnosis not present

## 2016-03-15 DIAGNOSIS — J3089 Other allergic rhinitis: Secondary | ICD-10-CM | POA: Diagnosis not present

## 2016-03-15 DIAGNOSIS — J301 Allergic rhinitis due to pollen: Secondary | ICD-10-CM | POA: Diagnosis not present

## 2016-03-15 MED ORDER — AMPHETAMINE-DEXTROAMPHETAMINE 20 MG PO TABS: 20.0000 mg | ORAL_TABLET | Freq: Two times a day (BID) | ORAL | 0 refills | Status: DC

## 2016-03-15 NOTE — Patient Instructions (Signed)
Generalized Anxiety Disorder Generalized anxiety disorder (GAD) is a mental disorder. It interferes with life functions, including relationships, work, and school. GAD is different from normal anxiety, which everyone experiences at some point in their lives in response to specific life events and activities. Normal anxiety actually helps us prepare for and get through these life events and activities. Normal anxiety goes away after the event or activity is over.  GAD causes anxiety that is not necessarily related to specific events or activities. It also causes excess anxiety in proportion to specific events or activities. The anxiety associated with GAD is also difficult to control. GAD can vary from mild to severe. People with severe GAD can have intense waves of anxiety with physical symptoms (panic attacks).  SYMPTOMS The anxiety and worry associated with GAD are difficult to control. This anxiety and worry are related to many life events and activities and also occur more days than not for 6 months or longer. People with GAD also have three or more of the following symptoms (one or more in children):  Restlessness.   Fatigue.  Difficulty concentrating.   Irritability.  Muscle tension.  Difficulty sleeping or unsatisfying sleep. DIAGNOSIS GAD is diagnosed through an assessment by your health care provider. Your health care provider will ask you questions aboutyour mood,physical symptoms, and events in your life. Your health care provider may ask you about your medical history and use of alcohol or drugs, including prescription medicines. Your health care provider may also do a physical exam and blood tests. Certain medical conditions and the use of certain substances can cause symptoms similar to those associated with GAD. Your health care provider may refer you to a mental health specialist for further evaluation. TREATMENT The following therapies are usually used to treat GAD:    Medication. Antidepressant medication usually is prescribed for long-term daily control. Antianxiety medicines may be added in severe cases, especially when panic attacks occur.   Talk therapy (psychotherapy). Certain types of talk therapy can be helpful in treating GAD by providing support, education, and guidance. A form of talk therapy called cognitive behavioral therapy can teach you healthy ways to think about and react to daily life events and activities.  Stress managementtechniques. These include yoga, meditation, and exercise and can be very helpful when they are practiced regularly. A mental health specialist can help determine which treatment is best for you. Some people see improvement with one therapy. However, other people require a combination of therapies.   This information is not intended to replace advice given to you by your health care provider. Make sure you discuss any questions you have with your health care provider.   Document Released: 11/13/2012 Document Revised: 08/09/2014 Document Reviewed: 11/13/2012 Elsevier Interactive Patient Education 2016 [employer]  

## 2016-03-15 NOTE — Progress Notes (Signed)
Pre visit review using our clinic review tool, if applicable. No additional management support is needed unless otherwise documented below in the visit note. 

## 2016-03-15 NOTE — Progress Notes (Signed)
Subjective:     Patient ID: Timothy Foster, male   DOB: [birth year], 30 y.o.   MRN: [id number]  HPI Patient here to discuss anxiety issues He has been treated for several years with Effexor present with for generalized anxiety disorder. This generally works well. He had a couple recent episodes where he was getting ready to go to work and one episode at work where he had sudden increase in anxiety symptoms. He recalls having diaphoresis on one occasion feeling very nervous with some palpitations. He took a lorazepam which did not seem to help much. He does not have a known history of panic disorder. No history of social anxiety disorder.  He is in regular counseling and has follow-up with them later today. He plans to go back to work this Wednesday. Denies any specific stressors. Recently started low-dose Adderall for ADD and that seems to be working well. He does not feel like this is triggering his anxiety symptoms  Past Medical History:  Diagnosis Date  . Allergy   . Anxiety   . Chicken pox   . Depression   . GERD (gastroesophageal reflux disease)   . Headache(784.0)   . Hyperlipidemia    Past Surgical History:  Procedure Laterality Date  . WISDOM TOOTH EXTRACTION      reports that he has never smoked. He has never used smokeless tobacco. He reports that he drinks alcohol. He reports that he does not use drugs. family history includes Alcohol abuse in his maternal uncle and paternal uncle; Cancer in his paternal uncle; Colon cancer in his paternal uncle; Crohn's disease in his maternal aunt; Diabetes in his father and mother; Esophageal cancer in his father; Pancreatic cancer in his paternal uncle; Stomach cancer in his paternal grandfather; Stroke in his father. Allergies  Allergen Reactions  . Penicillins     Hives as a child     Review of Systems  Constitutional: Negative for appetite change and unexpected weight change.  Respiratory: Negative for shortness of breath.    Cardiovascular: Negative for chest pain.  Psychiatric/Behavioral: Negative for agitation and confusion. The patient is nervous/anxious.        Objective:   Physical Exam  Constitutional: He is oriented to person, place, and time. He appears well-developed and well-nourished.  Neck: Neck supple. No thyromegaly present.  Cardiovascular: Normal rate and regular rhythm.  Exam reveals no gallop.   No murmur heard. Pulmonary/Chest: Effort normal and breath sounds normal. No respiratory distress. He has no wheezes. He has no rales.  Neurological: He is alert and oriented to person, place, and time. No cranial nerve deficit.       Assessment:     Anxiety. Question panic attack. He has underlying chronic anxiety with presumably  generalized anxiety which has generally been well controlled with Effexor    Plan:     -Continue regular psychotherapy -We discussed possible transition to another medication such as sertraline but at this point he wishes to stay the course. He has lorazepam to take as needed for severe anxiety symptoms but avoid regular use  [name] MD [facility]

## 2016-03-19 ENCOUNTER — Other Ambulatory Visit: Payer: Self-pay | Admitting: Family Medicine

## 2016-04-08 DIAGNOSIS — J301 Allergic rhinitis due to pollen: Secondary | ICD-10-CM | POA: Diagnosis not present

## 2016-04-08 DIAGNOSIS — J3089 Other allergic rhinitis: Secondary | ICD-10-CM | POA: Diagnosis not present

## 2016-04-09 ENCOUNTER — Other Ambulatory Visit: Payer: Self-pay

## 2016-04-09 ENCOUNTER — Other Ambulatory Visit: Payer: Self-pay | Admitting: Family Medicine

## 2016-04-09 ENCOUNTER — Encounter: Payer: Self-pay | Admitting: Family Medicine

## 2016-04-09 MED ORDER — FENOFIBRATE 160 MG PO TABS: 160.0000 mg | ORAL_TABLET | Freq: Every day | ORAL | 2 refills | Status: DC

## 2016-04-09 NOTE — Telephone Encounter (Signed)
Rx refill sent to pharmacy. 

## 2016-04-30 DIAGNOSIS — J3089 Other allergic rhinitis: Secondary | ICD-10-CM | POA: Diagnosis not present

## 2016-04-30 DIAGNOSIS — J301 Allergic rhinitis due to pollen: Secondary | ICD-10-CM | POA: Diagnosis not present

## 2016-05-03 DIAGNOSIS — M791 Myalgia: Secondary | ICD-10-CM | POA: Diagnosis not present

## 2016-05-03 DIAGNOSIS — M7542 Impingement syndrome of left shoulder: Secondary | ICD-10-CM | POA: Diagnosis not present

## 2016-05-03 DIAGNOSIS — J3089 Other allergic rhinitis: Secondary | ICD-10-CM | POA: Diagnosis not present

## 2016-05-03 DIAGNOSIS — J301 Allergic rhinitis due to pollen: Secondary | ICD-10-CM | POA: Diagnosis not present

## 2016-05-03 DIAGNOSIS — F4323 Adjustment disorder with mixed anxiety and depressed mood: Secondary | ICD-10-CM | POA: Diagnosis not present

## 2016-05-03 DIAGNOSIS — M9901 Segmental and somatic dysfunction of cervical region: Secondary | ICD-10-CM | POA: Diagnosis not present

## 2016-05-03 DIAGNOSIS — M9902 Segmental and somatic dysfunction of thoracic region: Secondary | ICD-10-CM | POA: Diagnosis not present

## 2016-05-13 ENCOUNTER — Ambulatory Visit (INDEPENDENT_AMBULATORY_CARE_PROVIDER_SITE_OTHER): Payer: BLUE CROSS/BLUE SHIELD | Admitting: *Deleted

## 2016-05-13 DIAGNOSIS — Z23 Encounter for immunization: Secondary | ICD-10-CM | POA: Diagnosis not present

## 2016-05-13 DIAGNOSIS — J301 Allergic rhinitis due to pollen: Secondary | ICD-10-CM | POA: Diagnosis not present

## 2016-05-13 DIAGNOSIS — J3089 Other allergic rhinitis: Secondary | ICD-10-CM | POA: Diagnosis not present

## 2016-05-20 DIAGNOSIS — F4323 Adjustment disorder with mixed anxiety and depressed mood: Secondary | ICD-10-CM | POA: Diagnosis not present

## 2016-05-20 DIAGNOSIS — M791 Myalgia: Secondary | ICD-10-CM | POA: Diagnosis not present

## 2016-05-20 DIAGNOSIS — M9902 Segmental and somatic dysfunction of thoracic region: Secondary | ICD-10-CM | POA: Diagnosis not present

## 2016-05-20 DIAGNOSIS — M7542 Impingement syndrome of left shoulder: Secondary | ICD-10-CM | POA: Diagnosis not present

## 2016-05-20 DIAGNOSIS — M9901 Segmental and somatic dysfunction of cervical region: Secondary | ICD-10-CM | POA: Diagnosis not present

## 2016-05-27 DIAGNOSIS — J3089 Other allergic rhinitis: Secondary | ICD-10-CM | POA: Diagnosis not present

## 2016-05-27 DIAGNOSIS — J301 Allergic rhinitis due to pollen: Secondary | ICD-10-CM | POA: Diagnosis not present

## 2016-05-27 DIAGNOSIS — F4323 Adjustment disorder with mixed anxiety and depressed mood: Secondary | ICD-10-CM | POA: Diagnosis not present

## 2016-05-28 ENCOUNTER — Ambulatory Visit: Payer: Self-pay | Admitting: *Deleted

## 2016-06-04 DIAGNOSIS — J3089 Other allergic rhinitis: Secondary | ICD-10-CM | POA: Diagnosis not present

## 2016-06-04 DIAGNOSIS — J301 Allergic rhinitis due to pollen: Secondary | ICD-10-CM | POA: Diagnosis not present

## 2016-06-07 DIAGNOSIS — F4323 Adjustment disorder with mixed anxiety and depressed mood: Secondary | ICD-10-CM | POA: Diagnosis not present

## 2016-06-14 DIAGNOSIS — F4323 Adjustment disorder with mixed anxiety and depressed mood: Secondary | ICD-10-CM | POA: Diagnosis not present

## 2016-07-02 DIAGNOSIS — J3089 Other allergic rhinitis: Secondary | ICD-10-CM | POA: Diagnosis not present

## 2016-07-02 DIAGNOSIS — J301 Allergic rhinitis due to pollen: Secondary | ICD-10-CM | POA: Diagnosis not present

## 2016-07-02 DIAGNOSIS — F4323 Adjustment disorder with mixed anxiety and depressed mood: Secondary | ICD-10-CM | POA: Diagnosis not present

## 2016-07-13 DIAGNOSIS — F4323 Adjustment disorder with mixed anxiety and depressed mood: Secondary | ICD-10-CM | POA: Diagnosis not present

## 2016-07-15 DIAGNOSIS — J301 Allergic rhinitis due to pollen: Secondary | ICD-10-CM | POA: Diagnosis not present

## 2016-07-15 DIAGNOSIS — J3089 Other allergic rhinitis: Secondary | ICD-10-CM | POA: Diagnosis not present

## 2016-07-23 DIAGNOSIS — J3089 Other allergic rhinitis: Secondary | ICD-10-CM | POA: Diagnosis not present

## 2016-07-23 DIAGNOSIS — J301 Allergic rhinitis due to pollen: Secondary | ICD-10-CM | POA: Diagnosis not present

## 2016-07-28 DIAGNOSIS — J301 Allergic rhinitis due to pollen: Secondary | ICD-10-CM | POA: Diagnosis not present

## 2016-07-28 DIAGNOSIS — J3089 Other allergic rhinitis: Secondary | ICD-10-CM | POA: Diagnosis not present

## 2016-07-29 DIAGNOSIS — J3089 Other allergic rhinitis: Secondary | ICD-10-CM | POA: Diagnosis not present

## 2016-08-16 DIAGNOSIS — M7542 Impingement syndrome of left shoulder: Secondary | ICD-10-CM | POA: Diagnosis not present

## 2016-08-16 DIAGNOSIS — M9902 Segmental and somatic dysfunction of thoracic region: Secondary | ICD-10-CM | POA: Diagnosis not present

## 2016-08-16 DIAGNOSIS — M791 Myalgia: Secondary | ICD-10-CM | POA: Diagnosis not present

## 2016-08-16 DIAGNOSIS — M9901 Segmental and somatic dysfunction of cervical region: Secondary | ICD-10-CM | POA: Diagnosis not present

## 2016-08-23 DIAGNOSIS — J301 Allergic rhinitis due to pollen: Secondary | ICD-10-CM | POA: Diagnosis not present

## 2016-08-23 DIAGNOSIS — J3089 Other allergic rhinitis: Secondary | ICD-10-CM | POA: Diagnosis not present

## 2016-08-26 DIAGNOSIS — J3089 Other allergic rhinitis: Secondary | ICD-10-CM | POA: Diagnosis not present

## 2016-08-26 DIAGNOSIS — J301 Allergic rhinitis due to pollen: Secondary | ICD-10-CM | POA: Diagnosis not present

## 2016-09-01 ENCOUNTER — Ambulatory Visit (INDEPENDENT_AMBULATORY_CARE_PROVIDER_SITE_OTHER): Payer: BLUE CROSS/BLUE SHIELD | Admitting: Family Medicine

## 2016-09-01 ENCOUNTER — Encounter: Payer: Self-pay | Admitting: Family Medicine

## 2016-09-01 DIAGNOSIS — E785 Hyperlipidemia, unspecified: Secondary | ICD-10-CM | POA: Diagnosis not present

## 2016-09-01 DIAGNOSIS — F411 Generalized anxiety disorder: Secondary | ICD-10-CM | POA: Diagnosis not present

## 2016-09-01 DIAGNOSIS — F988 Other specified behavioral and emotional disorders with onset usually occurring in childhood and adolescence: Secondary | ICD-10-CM

## 2016-09-01 LAB — HEPATIC FUNCTION PANEL
ALT: 60 U/L — ABNORMAL HIGH (ref 0–53)
AST: 30 U/L (ref 0–37)
Albumin: 4.8 g/dL (ref 3.5–5.2)
Alkaline Phosphatase: 62 U/L (ref 39–117)
Bilirubin, Direct: 0.1 mg/dL (ref 0.0–0.3)
Total Bilirubin: 0.6 mg/dL (ref 0.2–1.2)
Total Protein: 7.3 g/dL (ref 6.0–8.3)

## 2016-09-01 LAB — LIPID PANEL
Cholesterol: 190 mg/dL (ref 0–200)
HDL: 25.4 mg/dL — ABNORMAL LOW (ref >39.00)
NonHDL: 164.78
Total CHOL/HDL Ratio: 7
Triglycerides: 296 mg/dL — ABNORMAL HIGH (ref 0.0–149.0)
VLDL: 59.2 mg/dL — ABNORMAL HIGH (ref 0.0–40.0)

## 2016-09-01 LAB — LDL CHOLESTEROL, DIRECT: Direct LDL: 118 mg/dL

## 2016-09-01 MED ORDER — AMPHETAMINE-DEXTROAMPHET ER 20 MG PO CP24: 20.0000 mg | ORAL_CAPSULE | ORAL | 0 refills | Status: DC

## 2016-09-01 NOTE — Progress Notes (Signed)
Subjective:     Patient ID: Timothy Foster, male   DOB: 1986-01-18, 31 y.o.   MRN: [id number]  HPI  Patient here to discuss the following issues  Dyslipidemia. He has history of extremely high triglycerides (over 2,000) and low HDL. He takes fenofibrate. Positive family history type 2 diabetes in both parents. He has never had hyperglycemia. He is overdue for lipids. Compliant with therapy  Attention deficit disorder. Currently takes Adderall 20 mg one half tablet twice daily. He feels this is working well for ADD but he has noted that he has some nausea as the medication is wearing off. He is interested in trying extended-release Adderall. No insomnia or other side effects  History of generalized anxiety symptoms which are stable on Effexor. He has intermittent insomnia and takes trazodone infrequently for that  Past Medical History:  Diagnosis Date  . Allergy   . Anxiety   . Chicken pox   . Depression   . GERD (gastroesophageal reflux disease)   . Headache(784.0)   . Hyperlipidemia    Past Surgical History:  Procedure Laterality Date  . WISDOM TOOTH EXTRACTION      reports that he has never smoked. He has never used smokeless tobacco. He reports that he drinks alcohol. He reports that he does not use drugs. family history includes Alcohol abuse in his maternal uncle and paternal uncle; Cancer in his paternal uncle; Colon cancer in his paternal uncle; Crohn's disease in his maternal aunt; Diabetes in his father and mother; Esophageal cancer in his father; Pancreatic cancer in his paternal uncle; Stomach cancer in his paternal grandfather; Stroke in his father. Allergies  Allergen Reactions  . Penicillins     Hives as a child    Review of Systems  Constitutional: Negative for appetite change, fatigue and unexpected weight change.  Eyes: Negative for visual disturbance.  Respiratory: Negative for cough, chest tightness and shortness of breath.   Cardiovascular: Negative for chest  pain, palpitations and leg swelling.  Neurological: Negative for dizziness, syncope, weakness, light-headedness and headaches.  Psychiatric/Behavioral: Negative for dysphoric mood.       Objective:   Physical Exam  Constitutional: He is oriented to person, place, and time. He appears well-developed and well-nourished.  HENT:  Right Ear: External ear normal.  Left Ear: External ear normal.  Mouth/Throat: Oropharynx is clear and moist.  Eyes: Pupils are equal, round, and reactive to light.  Neck: Neck supple. No thyromegaly present.  Cardiovascular: Normal rate and regular rhythm.   Pulmonary/Chest: Effort normal and breath sounds normal. No respiratory distress. He has no wheezes. He has no rales.  Musculoskeletal: He exhibits no edema.  Neurological: He is alert and oriented to person, place, and time.       Assessment:     #1 dyslipidemia. Patient has high triglycerides and low HDL and very high risk for type 2 diabetes  #2 attention deficit disorder stable  #3 history of generalized anxiety stable on Effexor XR    Plan:     -Repeat lipid and hepatic panel this morning. -Needs fasting blood sugars some point this year. He did have a few sips of coffee with sugar and cream this morning so we will wait till next visit -.D/C Immediate release Adderall start Adderall XR 20 mg once daily -Continue Effexor which seems to be working well for his generalized anxiety  [name] MD [facility] at [facility]

## 2016-09-01 NOTE — Patient Instructions (Signed)

## 2016-09-01 NOTE — Progress Notes (Signed)
Pre visit review using our clinic review tool, if applicable. No additional management support is needed unless otherwise documented below in the visit note. 

## 2016-09-15 ENCOUNTER — Other Ambulatory Visit: Payer: Self-pay | Admitting: Family Medicine

## 2016-09-20 DIAGNOSIS — J3089 Other allergic rhinitis: Secondary | ICD-10-CM | POA: Diagnosis not present

## 2016-09-20 DIAGNOSIS — J301 Allergic rhinitis due to pollen: Secondary | ICD-10-CM | POA: Diagnosis not present

## 2016-09-22 DIAGNOSIS — J301 Allergic rhinitis due to pollen: Secondary | ICD-10-CM | POA: Diagnosis not present

## 2016-09-22 DIAGNOSIS — J3089 Other allergic rhinitis: Secondary | ICD-10-CM | POA: Diagnosis not present

## 2016-09-29 DIAGNOSIS — J3089 Other allergic rhinitis: Secondary | ICD-10-CM | POA: Diagnosis not present

## 2016-09-29 DIAGNOSIS — J301 Allergic rhinitis due to pollen: Secondary | ICD-10-CM | POA: Diagnosis not present

## 2016-09-30 DIAGNOSIS — F4323 Adjustment disorder with mixed anxiety and depressed mood: Secondary | ICD-10-CM | POA: Diagnosis not present

## 2016-10-27 DIAGNOSIS — F4323 Adjustment disorder with mixed anxiety and depressed mood: Secondary | ICD-10-CM | POA: Diagnosis not present

## 2016-11-18 DIAGNOSIS — F4323 Adjustment disorder with mixed anxiety and depressed mood: Secondary | ICD-10-CM | POA: Diagnosis not present

## 2016-11-25 ENCOUNTER — Other Ambulatory Visit: Payer: Self-pay | Admitting: Family Medicine

## 2016-11-25 DIAGNOSIS — F988 Other specified behavioral and emotional disorders with onset usually occurring in childhood and adolescence: Secondary | ICD-10-CM

## 2016-11-26 MED ORDER — AMPHETAMINE-DEXTROAMPHET ER 20 MG PO CP24: 20.0000 mg | ORAL_CAPSULE | ORAL | 0 refills | Status: DC

## 2016-11-26 MED ORDER — AMPHETAMINE-DEXTROAMPHET ER 20 MG PO CP24: 20.0000 mg | ORAL_CAPSULE | Freq: Every day | ORAL | 0 refills | Status: DC

## 2016-12-01 DIAGNOSIS — M7542 Impingement syndrome of left shoulder: Secondary | ICD-10-CM | POA: Diagnosis not present

## 2016-12-01 DIAGNOSIS — M9901 Segmental and somatic dysfunction of cervical region: Secondary | ICD-10-CM | POA: Diagnosis not present

## 2016-12-01 DIAGNOSIS — M9902 Segmental and somatic dysfunction of thoracic region: Secondary | ICD-10-CM | POA: Diagnosis not present

## 2016-12-01 DIAGNOSIS — M791 Myalgia: Secondary | ICD-10-CM | POA: Diagnosis not present

## 2016-12-06 ENCOUNTER — Other Ambulatory Visit: Payer: Self-pay | Admitting: Family Medicine

## 2016-12-06 ENCOUNTER — Encounter: Payer: Self-pay | Admitting: Family Medicine

## 2016-12-06 DIAGNOSIS — K649 Unspecified hemorrhoids: Secondary | ICD-10-CM

## 2016-12-17 ENCOUNTER — Other Ambulatory Visit: Payer: Self-pay | Admitting: Family Medicine

## 2017-01-17 ENCOUNTER — Ambulatory Visit (INDEPENDENT_AMBULATORY_CARE_PROVIDER_SITE_OTHER): Payer: BLUE CROSS/BLUE SHIELD | Admitting: Family Medicine

## 2017-01-17 ENCOUNTER — Encounter: Payer: Self-pay | Admitting: Family Medicine

## 2017-01-17 VITALS — BP 110/80 | HR 100 | Temp 98.4°F | Wt 210.1 lb

## 2017-01-17 DIAGNOSIS — M222X1 Patellofemoral disorders, right knee: Secondary | ICD-10-CM | POA: Diagnosis not present

## 2017-01-17 NOTE — Patient Instructions (Signed)
Patellofemoral Pain Syndrome Patellofemoral pain syndrome is a condition that involves a softening or breakdown of the tissue (cartilage) on the underside of your kneecap (patella). This causes pain in the front of the knee. The condition is also called runner's knee or chondromalacia patella. Patellofemoral pain syndrome is most common in young adults who are active in sports. Your knee is the largest joint in your body. The patella covers the front of your knee and is attached to muscles above and below your knee. The underside of the patella is covered with a smooth type of cartilage (synovium). The smooth surface helps the patella glide easily when you move your knee. Patellofemoral pain syndrome causes swelling in the joint linings and bone surfaces in your knee. What are the causes? Patellofemoral pain syndrome can be caused by:  Overuse.  Poor alignment of your knee joints.  Weak leg muscles.  A direct blow to your kneecap.  What increases the risk? You may be at risk for patellofemoral pain syndrome if you:  Do a lot of activities that can wear down your kneecap. These include: ? Running. ? Squatting. ? Climbing stairs.  Start a new physical activity or exercise program.  Wear shoes that do not fit well.  Do not have good leg strength.  Are overweight.  What are the signs or symptoms? Knee pain is the most common symptom of patellofemoral pain syndrome. This may feel like a dull, aching pain underneath your patella, in the front of your knee. There may be a popping or cracking sound when you move your knee. Pain may get worse with:  Exercise.  Climbing stairs.  Running.  Jumping.  Squatting.  Kneeling.  Sitting for a long time.  Moving or pushing on your patella.  How is this diagnosed? Your health care provider may be able to diagnose patellofemoral pain syndrome from your symptoms and medical history. You may be asked about your recent physical activities  and which ones cause knee pain. Your health care provider may do a physical exam with certain tests to confirm the diagnosis. These may include:  Moving your patella back and forth.  Checking your range of knee motion.  Having you squat or jump to see if you have pain.  Checking the strength of your leg muscles.  An MRI of the knee may also be done. How is this treated? Patellofemoral pain syndrome can usually be treated at home with rest, ice, compression, and elevation (RICE). Other treatments may include:  Nonsteroidal anti-inflammatory drugs (NSAIDs).  Physical therapy to stretch and strengthen your leg muscles.  Shoe inserts (orthotics) to take stress off your knee.  A knee brace or knee support.  Surgery to remove damaged cartilage or move the patella to a better position. The need for surgery is rare.  Follow these instructions at home:  Take medicines only as directed by your health care provider.  Rest your knee. ? When resting, keep your knee raised above the level of your heart. ? Avoid activities that cause knee pain.  Apply ice to the injured area: ? Put ice in a plastic bag. ? Place a towel between your skin and the bag. ? Leave the ice on for 20 minutes, 2-3 times a day.  Use splints, braces, knee supports, or walking aids as directed by your health care provider.  Perform stretching and strengthening exercises as directed by your health care provider or physical therapist.  Keep all follow-up visits as directed by your health care   provider. This is important. Contact a health care provider if:  Your symptoms get worse.  You are not improving with home care. This information is not intended to replace advice given to you by your health care provider. Make sure you discuss any questions you have with your health care provider. Document Released: 07/07/2009 Document Revised: 12/25/2015 Document Reviewed: 10/08/2013 Elsevier Interactive Patient Education   2018 Elsevier Inc.  

## 2017-01-17 NOTE — Progress Notes (Signed)
Subjective:     Patient ID: Timothy Foster, male   DOB: [birth year], 31 y.o.   MRN: [id number]  HPI Patient seen with right knee pain for the past couple weeks. He works as a Catering manager. Denies any recent injury. No redness. No swelling. He's noted that his pain seems to be worse after prolonged periods of sitting and relieved when he stretches his leg out with knee extension. Does not take any medication.  Pain appears to be posterior to the patellar region. Not localizing to medial or lateral aspect.  Past Medical History:  Diagnosis Date  . Allergy   . Anxiety   . Chicken pox   . Depression   . GERD (gastroesophageal reflux disease)   . Headache(784.0)   . Hyperlipidemia    Past Surgical History:  Procedure Laterality Date  . WISDOM TOOTH EXTRACTION      reports that he has never smoked. He has never used smokeless tobacco. He reports that he drinks alcohol. He reports that he does not use drugs. family history includes Alcohol abuse in his maternal uncle and paternal uncle; Cancer in his paternal uncle; Colon cancer in his paternal uncle; Crohn's disease in his maternal aunt; Diabetes in his father and mother; Esophageal cancer in his father; Pancreatic cancer in his paternal uncle; Stomach cancer in his paternal grandfather; Stroke in his father. Allergies  Allergen Reactions  . Penicillins     Hives as a child     Review of Systems  Neurological: Negative for weakness and numbness.       Objective:   Physical Exam  Constitutional: He appears well-developed and well-nourished.  Cardiovascular: Normal rate and regular rhythm.   Pulmonary/Chest: Effort normal and breath sounds normal. No respiratory distress. He has no wheezes. He has no rales.  Musculoskeletal:  Right knee full range of motion. No localized bony tenderness. Ligament testing is normal.       Assessment:     Right knee pain. 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

## 2017-01-25 DIAGNOSIS — M9901 Segmental and somatic dysfunction of cervical region: Secondary | ICD-10-CM | POA: Diagnosis not present

## 2017-01-25 DIAGNOSIS — M7542 Impingement syndrome of left shoulder: Secondary | ICD-10-CM | POA: Diagnosis not present

## 2017-01-25 DIAGNOSIS — M791 Myalgia: Secondary | ICD-10-CM | POA: Diagnosis not present

## 2017-01-25 DIAGNOSIS — M9902 Segmental and somatic dysfunction of thoracic region: Secondary | ICD-10-CM | POA: Diagnosis not present

## 2017-01-26 ENCOUNTER — Encounter: Payer: Self-pay | Admitting: Family Medicine

## 2017-01-26 MED ORDER — OMEPRAZOLE 40 MG PO CPDR: 40.0000 mg | DELAYED_RELEASE_CAPSULE | Freq: Every day | ORAL | 2 refills | Status: DC

## 2017-02-07 ENCOUNTER — Encounter: Payer: Self-pay | Admitting: Family Medicine

## 2017-02-22 ENCOUNTER — Telehealth: Payer: BLUE CROSS/BLUE SHIELD | Admitting: Nurse Practitioner

## 2017-02-22 DIAGNOSIS — J0101 Acute recurrent maxillary sinusitis: Secondary | ICD-10-CM

## 2017-02-22 MED ORDER — DOXYCYCLINE HYCLATE 100 MG PO TABS: 100.0000 mg | ORAL_TABLET | Freq: Two times a day (BID) | ORAL | 0 refills | Status: DC

## 2017-02-22 NOTE — Progress Notes (Signed)

## 2017-03-14 ENCOUNTER — Other Ambulatory Visit: Payer: Self-pay | Admitting: Family Medicine

## 2017-04-21 ENCOUNTER — Encounter: Payer: Self-pay | Admitting: Family Medicine

## 2017-05-26 ENCOUNTER — Ambulatory Visit (INDEPENDENT_AMBULATORY_CARE_PROVIDER_SITE_OTHER): Payer: BLUE CROSS/BLUE SHIELD | Admitting: *Deleted

## 2017-05-26 DIAGNOSIS — Z23 Encounter for immunization: Secondary | ICD-10-CM

## 2017-05-27 DIAGNOSIS — M7918 Myalgia, other site: Secondary | ICD-10-CM | POA: Diagnosis not present

## 2017-05-27 DIAGNOSIS — M9901 Segmental and somatic dysfunction of cervical region: Secondary | ICD-10-CM | POA: Diagnosis not present

## 2017-05-27 DIAGNOSIS — M9902 Segmental and somatic dysfunction of thoracic region: Secondary | ICD-10-CM | POA: Diagnosis not present

## 2017-05-27 DIAGNOSIS — M7542 Impingement syndrome of left shoulder: Secondary | ICD-10-CM | POA: Diagnosis not present

## 2017-06-15 ENCOUNTER — Other Ambulatory Visit: Payer: Self-pay | Admitting: Family Medicine

## 2017-06-20 DIAGNOSIS — L7211 Pilar cyst: Secondary | ICD-10-CM | POA: Diagnosis not present

## 2017-06-20 DIAGNOSIS — Z1283 Encounter for screening for malignant neoplasm of skin: Secondary | ICD-10-CM | POA: Diagnosis not present

## 2017-06-28 DIAGNOSIS — F4323 Adjustment disorder with mixed anxiety and depressed mood: Secondary | ICD-10-CM | POA: Diagnosis not present

## 2017-07-04 DIAGNOSIS — D485 Neoplasm of uncertain behavior of skin: Secondary | ICD-10-CM | POA: Diagnosis not present

## 2017-07-04 DIAGNOSIS — D225 Melanocytic nevi of trunk: Secondary | ICD-10-CM | POA: Diagnosis not present

## 2017-07-18 DIAGNOSIS — L988 Other specified disorders of the skin and subcutaneous tissue: Secondary | ICD-10-CM | POA: Diagnosis not present

## 2017-07-18 DIAGNOSIS — D485 Neoplasm of uncertain behavior of skin: Secondary | ICD-10-CM | POA: Diagnosis not present

## 2017-07-19 ENCOUNTER — Ambulatory Visit (INDEPENDENT_AMBULATORY_CARE_PROVIDER_SITE_OTHER): Payer: BLUE CROSS/BLUE SHIELD | Admitting: Family Medicine

## 2017-07-19 ENCOUNTER — Encounter: Payer: Self-pay | Admitting: Family Medicine

## 2017-07-19 VITALS — BP 110/80 | HR 106 | Temp 98.6°F | Ht 72.0 in | Wt 218.4 lb

## 2017-07-19 DIAGNOSIS — F4323 Adjustment disorder with mixed anxiety and depressed mood: Secondary | ICD-10-CM | POA: Diagnosis not present

## 2017-07-19 DIAGNOSIS — Z0000 Encounter for general adult medical examination without abnormal findings: Secondary | ICD-10-CM

## 2017-07-19 MED ORDER — LISDEXAMFETAMINE DIMESYLATE 30 MG PO CAPS: 30.0000 mg | ORAL_CAPSULE | Freq: Every day | ORAL | 0 refills | Status: DC

## 2017-07-19 NOTE — Progress Notes (Signed)
Subjective:     Patient ID: Timothy Foster, male   DOB: 01/28/1986, 31 y.o.   MRN: [id number]  HPI Patient seen for physical exam. He has history of some generalized anxiety and dyslipidemia and recurrent major depression. Also adult attention deficit disorder. He's taken Adderall intermittently but states he had some emotional lability with that. He would like to explore other options. Does not recall taking any other stimulants previously.  History of dysplastic nevi. Recently saw dermatology and had excision of a couple. He gets regular checkups through them. He had colonoscopy 2015 secondary rectal bleeding and this was basically benign.  Past Medical History:  Diagnosis Date  . Allergy   . Anxiety   . Chicken pox   . Depression   . GERD (gastroesophageal reflux disease)   . Headache(784.0)   . Hyperlipidemia    Past Surgical History:  Procedure Laterality Date  . WISDOM TOOTH EXTRACTION      reports that  has never smoked. he has never used smokeless tobacco. He reports that he drinks alcohol. He reports that he does not use drugs. family history includes Alcohol abuse in his maternal uncle and paternal uncle; Cancer in his paternal uncle; Colon cancer in his paternal uncle; Crohn's disease in his maternal aunt; Diabetes in his father and mother; Esophageal cancer in his father; Pancreatic cancer in his paternal uncle; Stomach cancer in his paternal grandfather; Stroke in his father. Allergies  Allergen Reactions  . Penicillins     Hives as a child     Review of Systems  Constitutional: Negative for activity change, appetite change, fatigue and fever.  HENT: Negative for congestion, ear pain and trouble swallowing.   Eyes: Negative for pain and visual disturbance.  Respiratory: Negative for cough, shortness of breath and wheezing.   Cardiovascular: Negative for chest pain and palpitations.  Gastrointestinal: Negative for abdominal distention, abdominal pain, blood in stool,  constipation, diarrhea, nausea, rectal pain and vomiting.  Genitourinary: Negative for dysuria, hematuria and testicular pain.  Musculoskeletal: Negative for arthralgias and joint swelling.  Skin: Negative for rash.  Neurological: Negative for dizziness, syncope and headaches.  Hematological: Negative for adenopathy.  Psychiatric/Behavioral: Negative for confusion and dysphoric mood.       Objective:   Physical Exam  Constitutional: He is oriented to person, place, and time. He appears well-developed and well-nourished. No distress.  HENT:  Head: Normocephalic and atraumatic.  Right Ear: External ear normal.  Left Ear: External ear normal.  Mouth/Throat: Oropharynx is clear and moist.  Eyes: Conjunctivae and EOM are normal. Pupils are equal, round, and reactive to light.  Neck: Normal range of motion. Neck supple. No thyromegaly present.  Cardiovascular: Normal rate, regular rhythm and normal heart sounds.  No murmur heard. Pulmonary/Chest: No respiratory distress. He has no wheezes. He has no rales.  Abdominal: Soft. Bowel sounds are normal. He exhibits no distension and no mass. There is no tenderness. There is no rebound and no guarding.  Musculoskeletal: He exhibits no edema.  Lymphadenopathy:    He has no cervical adenopathy.  Neurological: He is alert and oriented to person, place, and time. He displays normal reflexes. No cranial nerve deficit.  Skin: No rash noted.  Psychiatric: He has a normal mood and affect.       Assessment:     Physical exam. Following issues were addressed    Plan:     -placed order for future labs and he will return next week for those -We recommend he  discontinue Adderall and consider trial of Vyvanse 30 mg once daily. He'll give some feedback in one month  [name] MD [facility] at [facility]

## 2017-07-25 ENCOUNTER — Other Ambulatory Visit (INDEPENDENT_AMBULATORY_CARE_PROVIDER_SITE_OTHER): Payer: BLUE CROSS/BLUE SHIELD

## 2017-07-25 ENCOUNTER — Encounter: Payer: Self-pay | Admitting: Family Medicine

## 2017-07-25 DIAGNOSIS — Z0000 Encounter for general adult medical examination without abnormal findings: Secondary | ICD-10-CM

## 2017-07-25 LAB — BASIC METABOLIC PANEL
BUN: 14 mg/dL (ref 6–23)
CO2: 28 mEq/L (ref 19–32)
Calcium: 9.1 mg/dL (ref 8.4–10.5)
Chloride: 105 mEq/L (ref 96–112)
Creatinine, Ser: 1.13 mg/dL (ref 0.40–1.50)
GFR: 79.46 mL/min (ref >60.00)
Glucose, Bld: 95 mg/dL (ref 70–99)
Potassium: 4.2 mEq/L (ref 3.5–5.1)
Sodium: 140 mEq/L (ref 135–145)

## 2017-07-25 LAB — CBC WITH DIFFERENTIAL/PLATELET
Basophils Absolute: 0 10*3/uL (ref 0.0–0.1)
Basophils Relative: 0.6 % (ref 0.0–3.0)
Eosinophils Absolute: 0.2 10*3/uL (ref 0.0–0.7)
Eosinophils Relative: 3.1 % (ref 0.0–5.0)
HCT: 41 % (ref 39.0–52.0)
Hemoglobin: 14.2 g/dL (ref 13.0–17.0)
Lymphocytes Relative: 29.6 % (ref 12.0–46.0)
Lymphs Abs: 2.1 10*3/uL (ref 0.7–4.0)
MCHC: 34.5 g/dL (ref 30.0–36.0)
MCV: 92.7 fl (ref 78.0–100.0)
Monocytes Absolute: 0.9 10*3/uL (ref 0.1–1.0)
Monocytes Relative: 12.3 % — ABNORMAL HIGH (ref 3.0–12.0)
Neutro Abs: 3.9 10*3/uL (ref 1.4–7.7)
Neutrophils Relative %: 54.4 % (ref 43.0–77.0)
Platelets: 206 10*3/uL (ref 150.0–400.0)
RBC: 4.43 Mil/uL (ref 4.22–5.81)
RDW: 12.8 % (ref 11.5–15.5)
WBC: 7.2 10*3/uL (ref 4.0–10.5)

## 2017-07-25 LAB — HEPATIC FUNCTION PANEL
ALT: 39 U/L (ref 0–53)
AST: 22 U/L (ref 0–37)
Albumin: 4.5 g/dL (ref 3.5–5.2)
Alkaline Phosphatase: 53 U/L (ref 39–117)
Bilirubin, Direct: 0.1 mg/dL (ref 0.0–0.3)
Total Bilirubin: 0.7 mg/dL (ref 0.2–1.2)
Total Protein: 7.1 g/dL (ref 6.0–8.3)

## 2017-07-25 LAB — LIPID PANEL
Cholesterol: 141 mg/dL (ref 0–200)
HDL: 20.6 mg/dL — ABNORMAL LOW (ref >39.00)
NonHDL: 120.16
Total CHOL/HDL Ratio: 7
Triglycerides: 313 mg/dL — ABNORMAL HIGH (ref 0.0–149.0)
VLDL: 62.6 mg/dL — ABNORMAL HIGH (ref 0.0–40.0)

## 2017-07-25 LAB — TSH: TSH: 0.92 u[IU]/mL (ref 0.35–4.50)

## 2017-07-25 LAB — LDL CHOLESTEROL, DIRECT: Direct LDL: 91 mg/dL

## 2017-09-10 ENCOUNTER — Other Ambulatory Visit: Payer: Self-pay | Admitting: Family Medicine

## 2017-10-02 DIAGNOSIS — M25462 Effusion, left knee: Secondary | ICD-10-CM | POA: Diagnosis not present

## 2017-10-21 ENCOUNTER — Other Ambulatory Visit: Payer: Self-pay | Admitting: Family Medicine

## 2017-10-21 DIAGNOSIS — Z1283 Encounter for screening for malignant neoplasm of skin: Secondary | ICD-10-CM | POA: Diagnosis not present

## 2017-10-21 DIAGNOSIS — D2262 Melanocytic nevi of left upper limb, including shoulder: Secondary | ICD-10-CM | POA: Diagnosis not present

## 2017-10-21 DIAGNOSIS — D485 Neoplasm of uncertain behavior of skin: Secondary | ICD-10-CM | POA: Diagnosis not present

## 2017-10-21 DIAGNOSIS — D225 Melanocytic nevi of trunk: Secondary | ICD-10-CM | POA: Diagnosis not present

## 2017-12-12 ENCOUNTER — Other Ambulatory Visit: Payer: Self-pay | Admitting: Family Medicine

## 2018-01-03 DIAGNOSIS — F4323 Adjustment disorder with mixed anxiety and depressed mood: Secondary | ICD-10-CM | POA: Diagnosis not present

## 2018-01-16 ENCOUNTER — Encounter: Payer: Self-pay | Admitting: Family Medicine

## 2018-01-16 MED ORDER — FENOFIBRATE 160 MG PO TABS: 160.0000 mg | ORAL_TABLET | Freq: Every day | ORAL | 1 refills | Status: DC

## 2018-03-11 ENCOUNTER — Other Ambulatory Visit: Payer: Self-pay | Admitting: Family Medicine

## 2018-06-03 DIAGNOSIS — R369 Urethral discharge, unspecified: Secondary | ICD-10-CM | POA: Diagnosis not present

## 2018-06-03 DIAGNOSIS — N451 Epididymitis: Secondary | ICD-10-CM | POA: Diagnosis not present

## 2018-06-05 DIAGNOSIS — M9902 Segmental and somatic dysfunction of thoracic region: Secondary | ICD-10-CM | POA: Diagnosis not present

## 2018-06-05 DIAGNOSIS — M7542 Impingement syndrome of left shoulder: Secondary | ICD-10-CM | POA: Diagnosis not present

## 2018-06-05 DIAGNOSIS — M7918 Myalgia, other site: Secondary | ICD-10-CM | POA: Diagnosis not present

## 2018-06-05 DIAGNOSIS — M9901 Segmental and somatic dysfunction of cervical region: Secondary | ICD-10-CM | POA: Diagnosis not present

## 2018-06-19 DIAGNOSIS — F4323 Adjustment disorder with mixed anxiety and depressed mood: Secondary | ICD-10-CM | POA: Diagnosis not present

## 2018-07-05 ENCOUNTER — Encounter: Payer: Self-pay | Admitting: Family Medicine

## 2018-07-05 ENCOUNTER — Ambulatory Visit (INDEPENDENT_AMBULATORY_CARE_PROVIDER_SITE_OTHER): Payer: BLUE CROSS/BLUE SHIELD | Admitting: Family Medicine

## 2018-07-05 VITALS — BP 110/80 | HR 87 | Temp 97.8°F | Ht 71.0 in | Wt 215.5 lb

## 2018-07-05 DIAGNOSIS — E785 Hyperlipidemia, unspecified: Secondary | ICD-10-CM | POA: Diagnosis not present

## 2018-07-05 DIAGNOSIS — F5104 Psychophysiologic insomnia: Secondary | ICD-10-CM | POA: Diagnosis not present

## 2018-07-05 DIAGNOSIS — Z0184 Encounter for antibody response examination: Secondary | ICD-10-CM | POA: Diagnosis not present

## 2018-07-05 DIAGNOSIS — M79645 Pain in left finger(s): Secondary | ICD-10-CM | POA: Diagnosis not present

## 2018-07-05 MED ORDER — TRAZODONE HCL 50 MG PO TABS: 50.0000 mg | ORAL_TABLET | Freq: Every evening | ORAL | 6 refills | Status: DC | PRN

## 2018-07-05 NOTE — Progress Notes (Signed)
Subjective:     Patient ID: Timothy Foster, male   DOB: [birth year], 32 y.o.   MRN: [id number]  HPI Patient here today to discuss the following issues  Dyslipidemia history.  He has history of low HDL and high triglycerides.  Takes fenofibrate.  Is had very high triglycerides in the past.  Needs follow-up labs.  No side effects from medication  Left index finger pain past 2 weeks.  No specific injury.  Started after doing some scrub work.  Pain is actually between the MCP and PIP joint.  No bruising.  No visible swelling.  Pain is relatively mild  Patient has questions regarding vaccine status.  He thinks he may have only had one hepatitis B and is wondering about his immune status.  Also is questioning whether he needs MMR.  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  He has concerns about MMR risk.  Does not do much international travel  Has history of insomnia and takes trazodone as needed.  Requesting refill  Past Medical History:  Diagnosis Date  . Allergy   . Anxiety   . Chicken pox   . Depression   . GERD (gastroesophageal reflux disease)   . Headache(784.0)   . Hyperlipidemia    Past Surgical History:  Procedure Laterality Date  . WISDOM TOOTH EXTRACTION      reports that he has never smoked. He has never used smokeless tobacco. He reports that he drinks alcohol. He reports that he does not use drugs. family history includes Alcohol abuse in his maternal uncle and paternal uncle; Cancer in his paternal uncle; Colon cancer in his paternal uncle; Crohn's disease in his maternal aunt; Diabetes in his father and mother; Esophageal cancer in his father; Pancreatic cancer in his paternal uncle; Stomach cancer in his paternal grandfather; Stroke in his father. Allergies  Allergen Reactions  . Penicillins     Hives as a child     Review of Systems  Constitutional: Negative for fatigue.  Eyes: Negative for visual disturbance.  Respiratory: Negative for  cough, chest tightness and shortness of breath.   Cardiovascular: Negative for chest pain, palpitations and leg swelling.  Skin: Negative for rash.  Neurological: Negative for dizziness, syncope, weakness, light-headedness and headaches.       Objective:   Physical Exam  Constitutional: He appears well-developed and well-nourished.  HENT:  Cerumen impaction bilaterally  Neck: Neck supple.  Cardiovascular: Normal rate and regular rhythm.  No murmur heard. Pulmonary/Chest: Effort normal and breath sounds normal.  Abdominal: Soft. He exhibits no mass. There is no tenderness. There is no guarding.  Musculoskeletal: He exhibits no edema.  Left index finger no visible swelling.  No ecchymosis.  Full range of motion all digits.  Minimal bony tenderness midway between the PIP and MCP joint.  No palpable deformities or masses.  No erythema.  Lymphadenopathy:    He has no cervical adenopathy.       Assessment:     #1 dyslipidemia.  Patient has high triglycerides and low HDL and is on fenofibrate  #2 left index finger pain.  Question contusion.  #3 immunity status questions as above  #4 intermittent insomnia     Plan:     -Obtain follow-up labs with lipid panel and hepatic panel.  Patient would also like to get hepatitis B surface antibody to confirm whether he has immunity to hepatitis B. -He had questions about getting MMR booster.  In discussing this he got somewhat  dizzy and pale and had vasovagal mild reaction.  This improved after lying supine and drinking some fluids -Refill trazodone which she uses intermittently for insomnia -He will return tomorrow for lab work because of his request-he feels uneasy about getting labs today with his vagal reaction.  He will bring his partner with him tomorrow  [name] MD [facility]

## 2018-07-15 ENCOUNTER — Other Ambulatory Visit: Payer: Self-pay | Admitting: Family Medicine

## 2018-08-12 ENCOUNTER — Other Ambulatory Visit: Payer: Self-pay | Admitting: Family Medicine

## 2018-09-08 ENCOUNTER — Telehealth: Payer: BLUE CROSS/BLUE SHIELD | Admitting: Family

## 2018-09-08 DIAGNOSIS — B9689 Other specified bacterial agents as the cause of diseases classified elsewhere: Secondary | ICD-10-CM

## 2018-09-08 DIAGNOSIS — J329 Chronic sinusitis, unspecified: Secondary | ICD-10-CM

## 2018-09-08 MED ORDER — DOXYCYCLINE HYCLATE 100 MG PO TABS: 100.0000 mg | ORAL_TABLET | Freq: Two times a day (BID) | ORAL | 0 refills | Status: DC

## 2018-09-08 MED ORDER — BENZONATATE 100 MG PO CAPS: 100.0000 mg | ORAL_CAPSULE | Freq: Three times a day (TID) | ORAL | 0 refills | Status: DC | PRN

## 2018-09-08 NOTE — Progress Notes (Signed)
Greater than 5 minutes, yet less than 10 minutes of time have been spent researching, coordinating, and implementing care for this patient today.  Thank you for the details you included in the comment boxes. Those details are very helpful in determining the best course of treatment for you and help [place] to provide the best care.   As far as the work note, we can provide a note for the day you were seen and not more than that due to no physical exam. If you need more than 1 day, you must be seen in person. We normally treat with antibiotics day 5-7 or more, but you have a unique situation with your job. See plan below.   We are sorry that you are not feeling well.  Here is how we plan to help!  Based on what you have shared with me it looks like you have sinusitis.  Sinusitis is inflammation and infection in the sinus cavities of the head.  Based on your presentation I believe you most likely have Acute Bacterial Sinusitis.  This is an infection caused by bacteria and is treated with antibiotics. I have prescribed Doxycycline 100mg  by mouth twice a day for 10 days. You may use an oral decongestant such as Mucinex D or if you have glaucoma or high blood pressure use plain Mucinex. Saline nasal spray help and can safely be used as often as needed for congestion.  If you develop worsening sinus pain, fever or notice severe headache and vision changes, or if symptoms are not better after completion of antibiotic, please schedule an appointment with a health care provider.    I have also sent Tessalon Perles 100mg , take 1-2 every 8 hours as needed for cough.   Sinus infections are not as easily transmitted as other respiratory infection, however we still recommend that you avoid close contact with loved ones, especially the very young and elderly.  Remember to wash your hands thoroughly throughout the day as this is the number one way to prevent the spread of infection!  Home Care:  Only take medications as  instructed by your medical team.  Complete the entire course of an antibiotic.  Do not take these medications with alcohol.  A steam or ultrasonic humidifier can help congestion.  You can place a towel over your head and breathe in the steam from hot water coming from a faucet.  Avoid close contacts especially the very young and the elderly.  Cover your mouth when you cough or sneeze.  Always remember to wash your hands.  Get Help Right Away If:  You develop worsening fever or sinus pain.  You develop a severe head ache or visual changes.  Your symptoms persist after you have completed your treatment plan.  Make sure you  Understand these instructions.  Will watch your condition.  Will get help right away if you are not doing well or get worse.  Your e-visit answers were reviewed by a board certified advanced clinical practitioner to complete your personal care plan.  Depending on the condition, your plan could have included both over the counter or prescription medications.  If there is a problem please reply  once you have received a response from your provider.  Your safety is important to [place].  If you have drug allergies check your prescription carefully.    You can use MyChart to ask questions about today's visit, request a non-urgent call back, or ask for a work or school excuse for 24  hours related to this e-Visit. If it has been greater than 24 hours you will need to follow up with your provider, or enter a new e-Visit to address those concerns.  You will get an e-mail in the next two days asking about your experience.  I hope that your e-visit has been valuable and will speed your recovery. Thank you for using e-visits.

## 2018-09-09 ENCOUNTER — Other Ambulatory Visit: Payer: Self-pay | Admitting: Family Medicine

## 2018-09-18 ENCOUNTER — Other Ambulatory Visit (INDEPENDENT_AMBULATORY_CARE_PROVIDER_SITE_OTHER): Payer: BLUE CROSS/BLUE SHIELD

## 2018-09-18 ENCOUNTER — Encounter: Payer: Self-pay | Admitting: Family Medicine

## 2018-09-18 DIAGNOSIS — Z0184 Encounter for antibody response examination: Secondary | ICD-10-CM | POA: Diagnosis not present

## 2018-09-18 DIAGNOSIS — E785 Hyperlipidemia, unspecified: Secondary | ICD-10-CM

## 2018-09-18 LAB — HEPATIC FUNCTION PANEL
ALT: 49 U/L (ref 0–53)
AST: 25 U/L (ref 0–37)
Albumin: 5 g/dL (ref 3.5–5.2)
Alkaline Phosphatase: 62 U/L (ref 39–117)
Bilirubin, Direct: 0.1 mg/dL (ref 0.0–0.3)
Total Bilirubin: 0.8 mg/dL (ref 0.2–1.2)
Total Protein: 7.4 g/dL (ref 6.0–8.3)

## 2018-09-18 LAB — LIPID PANEL
Cholesterol: 192 mg/dL (ref 0–200)
HDL: 28.1 mg/dL — ABNORMAL LOW (ref >39.00)
NonHDL: 163.81
Total CHOL/HDL Ratio: 7
Triglycerides: 280 mg/dL — ABNORMAL HIGH (ref 0.0–149.0)
VLDL: 56 mg/dL — ABNORMAL HIGH (ref 0.0–40.0)

## 2018-09-18 LAB — LDL CHOLESTEROL, DIRECT: Direct LDL: 138 mg/dL

## 2018-09-19 LAB — HEPATITIS B SURFACE ANTIBODY,QUALITATIVE: Hep B S Ab: REACTIVE — AB

## 2018-09-30 ENCOUNTER — Ambulatory Visit (INDEPENDENT_AMBULATORY_CARE_PROVIDER_SITE_OTHER): Payer: BLUE CROSS/BLUE SHIELD

## 2018-09-30 ENCOUNTER — Ambulatory Visit
Admission: EM | Admit: 2018-09-30 | Discharge: 2018-09-30 | Disposition: A | Discharge status: Home / Self Care | Payer: BLUE CROSS/BLUE SHIELD | Attending: Physician Assistant | Admitting: Physician Assistant

## 2018-09-30 ENCOUNTER — Other Ambulatory Visit: Payer: Self-pay

## 2018-09-30 DIAGNOSIS — R05 Cough: Secondary | ICD-10-CM

## 2018-09-30 DIAGNOSIS — R058 Other specified cough: Secondary | ICD-10-CM

## 2018-09-30 MED ORDER — ALBUTEROL SULFATE HFA 108 (90 BASE) MCG/ACT IN AERS: 1.0000 | INHALATION_SPRAY | Freq: Four times a day (QID) | RESPIRATORY_TRACT | 0 refills | Status: DC | PRN

## 2018-09-30 MED ORDER — PREDNISONE 20 MG PO TABS: 20.0000 mg | ORAL_TABLET | Freq: Every day | ORAL | 0 refills | Status: DC

## 2018-09-30 NOTE — ED Triage Notes (Signed)
Per pt he has been cough , congestion in his chest for about a month with worsening the last week. Pt has had no fevers, chills.

## 2018-09-30 NOTE — ED Provider Notes (Signed)
09/30/2018 1:07 PM   DOB: 04-03-1986 / MRN: [id number]  SUBJECTIVE:  Timothy Foster is a 33 y.o. male presenting for cough x1 month which seem to get worse today.  Reports that his partner also has a similar cough.  He denies heartburn though does have a history of GERD well-controlled with Prilosec, sneezing, eye itching, ear itching, mouth itching.  States he had some other upper respiratory tract symptoms which have resolved near the beginning of this illness.  He is allergic to penicillins.   He  has a past medical history of Allergy, Anxiety, Chicken pox, Depression, GERD (gastroesophageal reflux disease), Headache(784.0), and Hyperlipidemia.    He  reports that he has never smoked. He has never used smokeless tobacco. He reports current alcohol use. He reports that he does not use drugs. He  has no history on file for sexual activity. The patient  has a past surgical history that includes Wisdom tooth extraction.  His family history includes Alcohol abuse in his maternal uncle and paternal uncle; Cancer in his paternal uncle; Colon cancer in his paternal uncle; Crohn's disease in his maternal aunt; Diabetes in his father and mother; Esophageal cancer in his father; Pancreatic cancer in his paternal uncle; Stomach cancer in his paternal grandfather; Stroke in his father.  Review of Systems  Constitutional: Negative for diaphoresis.  Respiratory: Positive for cough. Negative for hemoptysis, sputum production, shortness of breath and wheezing.   Cardiovascular: Negative for orthopnea and leg swelling.  Gastrointestinal: Negative for nausea.  Musculoskeletal: Negative for myalgias.  Skin: Negative for rash.  Neurological: Negative for dizziness.    OBJECTIVE:  BP (!) 138/102 (BP Location: Left Arm)   Pulse (!) 104   Temp 97.7 F (36.5 C) (Oral)   Resp 16   Ht 5\' 11"  (1.803 m)   Wt 210 lb (95.3 kg)   SpO2 96%   BMI 29.29 kg/m   Wt Readings from Last 3 Encounters:  09/30/18 210 lb  (95.3 kg)  07/05/18 215 lb 8 oz (97.8 kg)  07/19/17 218 lb 6.4 oz (99.1 kg)   Temp Readings from Last 3 Encounters:  09/30/18 97.7 F (36.5 C) (Oral)  07/05/18 97.8 F (36.6 C)  07/19/17 98.6 F (37 C) (Oral)   BP Readings from Last 3 Encounters:  09/30/18 (!) 138/102  07/05/18 110/80  07/19/17 110/80   Pulse Readings from Last 3 Encounters:  09/30/18 (!) 104  07/05/18 87  07/19/17 (!) 106    Physical Exam Vitals signs and nursing note reviewed.  Constitutional:      Appearance: He is well-developed. He is not ill-appearing or diaphoretic.  Eyes:     Conjunctiva/sclera: Conjunctivae normal.     Pupils: Pupils are equal, round, and reactive to light.  Cardiovascular:     Rate and Rhythm: Tachycardia present.  Pulmonary:     Effort: Pulmonary effort is normal. No respiratory distress.     Breath sounds: No stridor. No wheezing, rhonchi or rales.  Chest:     Chest wall: No tenderness.  Abdominal:     General: There is no distension.  Musculoskeletal: Normal range of motion.  Skin:    General: Skin is warm and dry.  Neurological:     Mental Status: He is alert and oriented to person, place, and time.     Cranial Nerves: No cranial nerve deficit.     Coordination: Coordination normal.     No results found for this or any previous visit (from the past 72 hour(s)).  No results found.  ASSESSMENT AND PLAN:   Cough present for greater than 3 weeks  Post-viral cough syndrome    Discharge Instructions     Your x-ray appears to be normal today.  I will contact you if the radiologist change.  I prescribed you some prednisone along with an albuterol inhaler.  Please take these medications as prescribed.  Feeling much better.        The patient is advised to call or return to clinic if he does not see an improvement in symptoms, or to seek the care of the closest emergency department if he worsens with the above plan.   Philis Fendt, MHS, PA-C 09/30/2018  1:07 PM   Tereasa Coop, PA-C 09/30/18 1307

## 2018-09-30 NOTE — Discharge Instructions (Addendum)
Your x-ray appears to be normal today.  I will contact you if the radiologist change.  I prescribed you some prednisone along with an albuterol inhaler.  Please take these medications as prescribed.  Feeling much better.

## 2018-10-09 ENCOUNTER — Encounter: Payer: Self-pay | Admitting: Family Medicine

## 2018-10-17 ENCOUNTER — Other Ambulatory Visit: Payer: Self-pay | Admitting: Family Medicine

## 2018-10-24 ENCOUNTER — Other Ambulatory Visit: Payer: Self-pay | Admitting: Family Medicine

## 2018-12-12 ENCOUNTER — Other Ambulatory Visit: Payer: Self-pay | Admitting: Family Medicine

## 2018-12-12 ENCOUNTER — Encounter: Payer: Self-pay | Admitting: Family Medicine

## 2018-12-12 DIAGNOSIS — Z20828 Contact with and (suspected) exposure to other viral communicable diseases: Secondary | ICD-10-CM

## 2018-12-12 DIAGNOSIS — Z20822 Contact with and (suspected) exposure to covid-19: Secondary | ICD-10-CM

## 2018-12-13 NOTE — Telephone Encounter (Signed)
Let him know I have ordered and he can come by and get.

## 2018-12-14 DIAGNOSIS — Z03818 Encounter for observation for suspected exposure to other biological agents ruled out: Secondary | ICD-10-CM | POA: Diagnosis not present

## 2019-02-05 DIAGNOSIS — H168 Other keratitis: Secondary | ICD-10-CM | POA: Diagnosis not present

## 2019-03-21 DIAGNOSIS — F4323 Adjustment disorder with mixed anxiety and depressed mood: Secondary | ICD-10-CM | POA: Diagnosis not present

## 2019-04-18 ENCOUNTER — Other Ambulatory Visit: Payer: Self-pay | Admitting: Family Medicine

## 2019-05-05 ENCOUNTER — Ambulatory Visit
Admission: EM | Admit: 2019-05-05 | Discharge: 2019-05-05 | Disposition: A | Discharge status: Home / Self Care | Payer: BC Managed Care – PPO | Attending: Physician Assistant | Admitting: Physician Assistant

## 2019-05-05 ENCOUNTER — Other Ambulatory Visit: Payer: Self-pay

## 2019-05-05 DIAGNOSIS — R5383 Other fatigue: Secondary | ICD-10-CM | POA: Diagnosis not present

## 2019-05-05 DIAGNOSIS — I10 Essential (primary) hypertension: Secondary | ICD-10-CM

## 2019-05-05 DIAGNOSIS — R0981 Nasal congestion: Secondary | ICD-10-CM

## 2019-05-05 DIAGNOSIS — R42 Dizziness and giddiness: Secondary | ICD-10-CM

## 2019-05-05 DIAGNOSIS — R0789 Other chest pain: Secondary | ICD-10-CM | POA: Diagnosis not present

## 2019-05-05 MED ORDER — FLUTICASONE PROPIONATE 50 MCG/ACT NA SUSP: 2.0000 | Freq: Every day | NASAL | 0 refills | Status: DC

## 2019-05-05 MED ORDER — MELOXICAM 7.5 MG PO TABS: 7.5000 mg | ORAL_TABLET | Freq: Every day | ORAL | 0 refills | Status: DC

## 2019-05-05 NOTE — ED Provider Notes (Signed)
[facility]    CSN: [id number] Arrival date & time: 05/05/19  1226      History   Chief Complaint No chief complaint on file.   HPI Timothy Foster is a 33 y.o. male.   33-year-old male with history of seasonal allergies, hyperlipidemia, GERD comes in for few day history of hypertension, headache, dizziness.  Patient states first noticed 4 days ago, when after yard work felt tired, and took his blood pressure.  States BP was 181/106.  States has also noticed right sided chest tightness that is intermittent, no aggravating or alleviating factor.  Denies shortness of breath.  Denies chest pain.  Denies nausea/vomiting, diaphoresis, orthopnea.  Does have mild nasal congestion without cough, sore throat, rhinorrhea.  Denies fever, chills, body aches.  Denies abdominal pain, diarrhea.  Denies loss of taste or smell.  He has noticed some exertional fatigue without any exertional chest pain, dyspnea on exertion.  Never smoker.  Patient denies history of hypertension.  Does see PCP regularly, and has not been mentioned to have high blood pressure reading. He denies personal history of heart disease.  Unsure of family history of heart disease.     Past Medical History:  Diagnosis Date  . Allergy   . Anxiety   . Chicken pox   . Depression   . GERD (gastroesophageal reflux disease)   . Headache(784.0)   . Hyperlipidemia     Patient Active Problem List   Diagnosis Date Noted  . Anxiety state 03/15/2016  . ADD (attention deficit disorder) 01/27/2016  . Chronic insomnia 10/16/2014  . GERD (gastroesophageal reflux disease) 09/27/2012  . Depression 09/27/2012  . Dyslipidemia 09/27/2012    Past Surgical History:  Procedure Laterality Date  . WISDOM TOOTH EXTRACTION         Home Medications    Prior to Admission medications   Medication Sig Start Date End Date Taking? Authorizing Provider  fenofibrate 160 MG tablet TAKE 1 TABLET DAILY (PLEASE COME IN FOR LABS FOR  FURTHER REFILLS [phone]) 10/18/18  Yes Burchette, Bruce W, MD  fexofenadine (ALLEGRA) 180 MG tablet Take 1 tablet (180 mg total) by mouth daily. 03/10/16  Yes Burchette, Bruce W, MD  omeprazole (PRILOSEC) 40 MG capsule TAKE 1 CAPSULE DAILY (SCHEDULE LAB APPOINTMENT FOR FURTHER REFILLS [phone]) 04/18/19  Yes Burchette, Bruce W, MD  traZODone (DESYREL) 50 MG tablet Take 1 tablet (50 mg total) by mouth at bedtime as needed for sleep. 07/05/18  Yes Burchette, Bruce W, MD  venlafaxine XR (EFFEXOR-XR) 150 MG 24 hr capsule Take 1 capsule (150 mg total) by mouth daily. 12/12/18  Yes Burchette, Bruce W, MD  albuterol (PROVENTIL HFA;VENTOLIN HFA) 108 (90 Base) MCG/ACT inhaler Inhale 1-2 puffs into the lungs every 6 (six) hours as needed for wheezing or shortness of breath. 09/30/18   Clark, Michael L, PA-C  fluticasone (FLONASE) 50 MCG/ACT nasal spray Place 2 sprays into both nostrils daily. 05/05/19   Yu, Amy V, PA-C  lisdexamfetamine (VYVANSE) 30 MG capsule Take 1 capsule (30 mg total) by mouth daily. 07/19/17   Burchette, Bruce W, MD  meloxicam (MOBIC) 7.5 MG tablet Take 1 tablet (7.5 mg total) by mouth daily. 05/05/19   Yu, Amy V, PA-C  predniSONE (DELTASONE) 20 MG tablet Take 1 tablet (20 mg total) by mouth daily with breakfast. 09/30/18   Clark, Michael L, PA-C    Family History Family History  Problem Relation Age of Onset  . Diabetes Mother   . Stroke Father   .   Diabetes Father   . Esophageal cancer Father   . Alcohol abuse Maternal Uncle   . Alcohol abuse Paternal Uncle   . Cancer Paternal Uncle   . Pancreatic cancer Paternal Uncle   . Colon cancer Paternal Uncle   . Crohn's disease Maternal Aunt   . Stomach cancer Paternal Grandfather     Social History Social History   Tobacco Use  . Smoking status: Never Smoker  . Smokeless tobacco: Never Used  Substance Use Topics  . Alcohol use: Yes    Comment: couple times per month  . Drug use: No     Allergies   Penicillins    Review of Systems Review of Systems  Reason unable to perform ROS: See HPI as above.     Physical Exam Triage Vital Signs ED Triage Vitals  Enc Vitals Group     BP 05/05/19 1242 (!) 153/101     Pulse Rate 05/05/19 1242 96     Resp 05/05/19 1242 16     Temp 05/05/19 1242 98.6 F (37 C)     Temp Source 05/05/19 1242 Oral     SpO2 05/05/19 1242 97 %     Weight --      Height --      Head Circumference --      Peak Flow --      Pain Score 05/05/19 1239 4     Pain Loc --      Pain Edu? --      Excl. in [place]? --    Orthostatic VS for the past 24 hrs:  BP- Lying Pulse- Lying BP- Sitting Pulse- Sitting BP- Standing at 0 minutes Pulse- Standing at 0 minutes  05/05/19 1309 142/90 84 (!) 144/91 91 (!) 151/111 97    Updated Vital Signs BP (!) 142/94   Pulse 96   Temp 98.6 F (37 C) (Oral)   Resp 16   SpO2 97%   Physical Exam Constitutional:      General: He is not in acute distress.    Appearance: Normal appearance. He is not ill-appearing, toxic-appearing or diaphoretic.  HENT:     Head: Normocephalic and atraumatic.     Ears:     Comments: Bilateral cerumen impaction, TM not visible.    Nose: Nose normal.     Right Sinus: No maxillary sinus tenderness or frontal sinus tenderness.     Left Sinus: No maxillary sinus tenderness or frontal sinus tenderness.     Mouth/Throat:     Mouth: Mucous membranes are moist.     Pharynx: Oropharynx is clear. Uvula midline.     Tonsils: No tonsillar exudate.  Eyes:     Extraocular Movements: Extraocular movements intact.     Conjunctiva/sclera: Conjunctivae normal.     Pupils: Pupils are equal, round, and reactive to light.  Neck:     Musculoskeletal: Normal range of motion and neck supple.  Cardiovascular:     Rate and Rhythm: Normal rate and regular rhythm.     Heart sounds: Normal heart sounds. No murmur. No friction rub. No gallop.   Pulmonary:     Effort: Pulmonary effort is normal. No accessory muscle usage, prolonged  expiration, respiratory distress or retractions.     Comments: Lungs clear to auscultation without adventitious lung sounds. Chest:     Comments: Tenderness to palpation of sternum, right chest. Abdominal:     General: Bowel sounds are normal.     Palpations: Abdomen is soft.  Tenderness: There is no abdominal tenderness. There is no right CVA tenderness, left CVA tenderness, guarding or rebound. Negative signs include Murphy's sign.  Musculoskeletal:        General: No swelling or tenderness.     Right lower leg: No edema.     Left lower leg: No edema.  Neurological:     General: No focal deficit present.     Mental Status: He is alert and oriented to person, place, and time.     GCS: GCS eye subscore is 4. GCS verbal subscore is 5. GCS motor subscore is 6.     Cranial Nerves: Cranial nerves are intact.     Sensory: Sensation is intact.     Motor: Motor function is intact.     Coordination: Coordination is intact.     Gait: Gait is intact.      UC Treatments / Results  Labs (all labs ordered are listed, but only abnormal results are displayed) Labs Reviewed - No data to display  EKG   Radiology No results found.  Procedures Procedures (including critical care time)  Medications Ordered in UC Medications - No data to display  Initial Impression / Assessment and Plan / UC Course  I have reviewed the triage vital signs and the nursing notes.  Pertinent labs & imaging results that were available during my care of the patient were reviewed by me and considered in my medical decision making (see chart for details).    No alarming signs on exam.  Neurology exam without focal deficits.  EKG normal sinus rhythm, 86 bpm, no obvious ST changes.  No prior EKG for comparison.  Negative orthostatics.  BP at triage was 153/101, reduced to 142/94 during visit.  Would like COVID testing due to recent travel and nasal congestion with fatigue, no test kit available in office today.   Offered to order drive-through test.  Patient states has PCP appointment in 2 days, and will follow-up with PCP if needed for cover testing.  However, will contact us for drive-through test if PCP unable to order.  At this time, will treat patient for chest wall tenderness with Mobic, Flonase for congestion.  Push fluids.  Will have patient document BP, decrease salt intake, and recheck with PCP at appointment.  Strict return precautions given.  Patient expresses understanding and agrees to plan.  Given exertional fatigue, also discussed with patient to follow-up with cardiology for baseline cardiac work-up.  Cardiology resources provided.  Final Clinical Impressions(s) / UC Diagnoses   Final diagnoses:  Dizziness  Chest wall pain   ED Prescriptions    Medication Sig Dispense Auth. Provider   meloxicam (MOBIC) 7.5 MG tablet Take 1 tablet (7.5 mg total) by mouth daily. 10 tablet Yu, Amy V, PA-C   fluticasone (FLONASE) 50 MCG/ACT nasal spray Place 2 sprays into both nostrils daily. 1 g Yu, Amy V, PA-C     PDMP not reviewed this encounter.   Yu, Amy V, PA-C 05/05/19 1403  

## 2019-05-05 NOTE — Discharge Instructions (Addendum)
No alarming signs on exam. EKG normal, no signs of dehydration. Will have you start mobic for chest wall tenderness possibly causing your chest tightness. Flonase for congestion. Keep hydrated, urine should be clear to pale yellow in color. As discussed, would like COVID testing, please either obtain testing with PCP, or we can order drive through test. Given your exertional fatigue, please follow up with cardiology for further evaluation. If noticing chest pain, shortness of breath, dizziness, passing out, nausea/vomiting, sweating, go to the ED for further evaluation needed.  Please keep track of your BP at least twice a day with pulse rate and document. Decrease salt intake. Follow up with PCP as scheduled in 2 days for further evaluation and management needed.

## 2019-05-05 NOTE — ED Triage Notes (Addendum)
BP has been high for the last few days. First noticed this Wednesday night after doing yardwork.  Headache and "felt weird" Wednesday, checked BP and it was 181/106. Some lightheadedness as well.   PT has never been on BP meds or had issues.   Some chest tightness intermittently for the past few days.   Flew to [facility] Thursday and Friday.   Reports fatigue and nasal congestion as well. Requests COVID testing

## 2019-05-07 ENCOUNTER — Encounter: Payer: Self-pay | Admitting: Family Medicine

## 2019-05-07 ENCOUNTER — Other Ambulatory Visit: Payer: Self-pay

## 2019-05-07 ENCOUNTER — Ambulatory Visit (INDEPENDENT_AMBULATORY_CARE_PROVIDER_SITE_OTHER): Payer: BC Managed Care – PPO | Admitting: Family Medicine

## 2019-05-07 VITALS — BP 130/80 | HR 110 | Temp 98.2°F | Wt 224.2 lb

## 2019-05-07 DIAGNOSIS — R030 Elevated blood-pressure reading, without diagnosis of hypertension: Secondary | ICD-10-CM

## 2019-05-07 DIAGNOSIS — R079 Chest pain, unspecified: Secondary | ICD-10-CM | POA: Diagnosis not present

## 2019-05-07 LAB — HEMOGLOBIN A1C: Hemoglobin A1C: 5.3

## 2019-05-07 NOTE — Patient Instructions (Signed)
We are setting up cardiology referral  Monitor blood pressure and be in touch if consistently > 140/90.

## 2019-05-07 NOTE — Progress Notes (Signed)
Subjective:     Patient ID: Timothy Foster, male   DOB: [birth year], 33 y.o.   MRN: [id number]  HPI Timothy Foster has history of ADD, chronic insomnia, dyslipidemia.  Is also had a history of some GERD.  He is seen today for the following issues  Elevated blood pressure.  No prior diagnosis of hypertension.  He was recently doing some yard work and had some nonspecific dizziness.  No syncope.  He went to get his blood pressure checked by someone and apparently obtain reading of 220/180.  We explained this was probably an error.  He then has several other subsequent readings including 186/109, 169/114, 170/129.  Occasional headaches.  Went to urgent care and blood pressure was slightly high there but much improved compared to home readings.  He had EKG done which showed no acute changes.  He had mentioned having some "chest tightness intermittently since last Wednesday.  Sometimes symptoms prolonged lasting hours and sometimes lesser duration.  Sometimes at rest and sometimes with exertion.  He has no cardiac history.  No family history of premature CAD.  Non-smoker.  Rare alcohol use.  No regular nonsteroidal use.  Does have history of severe elevated triglycerides and currently takes fenofibrate  Past Medical History:  Diagnosis Date  . Allergy   . Anxiety   . Chicken pox   . Depression   . GERD (gastroesophageal reflux disease)   . Headache(784.0)   . Hyperlipidemia    Past Surgical History:  Procedure Laterality Date  . WISDOM TOOTH EXTRACTION      reports that he has never smoked. He has never used smokeless tobacco. He reports current alcohol use. He reports that he does not use drugs. family history includes Alcohol abuse in his maternal uncle and paternal uncle; Cancer in his paternal uncle; Colon cancer in his paternal uncle; Crohn's disease in his maternal aunt; Diabetes in his father and mother; Esophageal cancer in his father; Pancreatic cancer in his paternal uncle; Stomach cancer in his  paternal grandfather; Stroke in his father. Allergies  Allergen Reactions  . Penicillins     Hives as a child     Review of Systems  Constitutional: Negative for chills, fatigue, fever and unexpected weight change.  Eyes: Negative for visual disturbance.  Respiratory: Positive for chest tightness. Negative for cough, shortness of breath and wheezing.   Cardiovascular: Positive for chest pain. Negative for palpitations and leg swelling.  Gastrointestinal: Negative for abdominal pain.  Neurological: Negative for syncope, weakness, light-headedness and headaches.  Hematological: Negative for adenopathy.       Objective:   Physical Exam Vitals signs reviewed.  Constitutional:      Appearance: Normal appearance.  Neck:     Musculoskeletal: Neck supple.  Cardiovascular:     Rate and Rhythm: Normal rate and regular rhythm.  Pulmonary:     Effort: Pulmonary effort is normal.     Breath sounds: Normal breath sounds.  Musculoskeletal:     Right lower leg: No edema.     Left lower leg: No edema.  Lymphadenopathy:     Cervical: No cervical adenopathy.  Neurological:     Mental Status: He is alert.        Assessment:     #1 recent elevated blood pressure.  He had some severe readings by home but much improved here today with 130/80 confirmed by my reading as well  #2 longstanding history of dyslipidemia  #3 recent chest pain.  Somewhat atypical in terms of duration but  did describe some tightness occasionally worse with activity.  EKG reportedly unremarkable through urgent care    Plan:     -Regarding blood pressure we recommended observation for now.  Discussed nonpharmacologic factors as they pertain to blood pressure.  Be in touch if consistently greater than 140/90  -Set up cardiology referral for consideration for stress testing.  Follow-up immediately or call 911 for any worsening chest pain  [name] MD [facility]

## 2019-05-08 ENCOUNTER — Other Ambulatory Visit: Payer: Self-pay

## 2019-05-08 DIAGNOSIS — Z20822 Contact with and (suspected) exposure to covid-19: Secondary | ICD-10-CM

## 2019-05-08 DIAGNOSIS — Z20828 Contact with and (suspected) exposure to other viral communicable diseases: Secondary | ICD-10-CM | POA: Diagnosis not present

## 2019-05-09 ENCOUNTER — Ambulatory Visit: Payer: BC Managed Care – PPO | Admitting: Cardiology

## 2019-05-10 LAB — NOVEL CORONAVIRUS, NAA: SARS-CoV-2, NAA: NOT DETECTED

## 2019-05-11 ENCOUNTER — Encounter: Payer: Self-pay | Admitting: Family Medicine

## 2019-05-22 ENCOUNTER — Encounter: Payer: Self-pay | Admitting: Family Medicine

## 2019-05-22 MED ORDER — LOSARTAN POTASSIUM 50 MG PO TABS: 50.0000 mg | ORAL_TABLET | Freq: Every day | ORAL | 11 refills | Status: DC

## 2019-05-22 NOTE — Telephone Encounter (Signed)
Spoke with patient.  He has had consistent blood pressure readings past week over 140/100.  He has had some intermittent headaches and has had some continued atypical chest pains.  Cardiology referral pending.  Start losartan 50 mg once daily.  Have recommended office follow-up in about 3 weeks to reassess

## 2019-06-01 ENCOUNTER — Ambulatory Visit (INDEPENDENT_AMBULATORY_CARE_PROVIDER_SITE_OTHER): Payer: BC Managed Care – PPO | Admitting: Cardiology

## 2019-06-01 ENCOUNTER — Other Ambulatory Visit: Payer: Self-pay

## 2019-06-01 ENCOUNTER — Encounter: Payer: Self-pay | Admitting: Cardiology

## 2019-06-01 VITALS — BP 124/90 | HR 78 | Temp 97.9°F | Ht 70.0 in | Wt 224.0 lb

## 2019-06-01 DIAGNOSIS — R072 Precordial pain: Secondary | ICD-10-CM | POA: Diagnosis not present

## 2019-06-01 DIAGNOSIS — Z01812 Encounter for preprocedural laboratory examination: Secondary | ICD-10-CM

## 2019-06-01 DIAGNOSIS — E781 Pure hyperglyceridemia: Secondary | ICD-10-CM

## 2019-06-01 DIAGNOSIS — Z7189 Other specified counseling: Secondary | ICD-10-CM

## 2019-06-01 DIAGNOSIS — I10 Essential (primary) hypertension: Secondary | ICD-10-CM

## 2019-06-01 LAB — BASIC METABOLIC PANEL
BUN/Creatinine Ratio: 9 (ref 9–20)
BUN: 10 mg/dL (ref 6–20)
CO2: 21 mmol/L (ref 20–29)
Calcium: 10 mg/dL (ref 8.7–10.2)
Chloride: 105 mmol/L (ref 96–106)
Creatinine, Ser: 1.11 mg/dL (ref 0.76–1.27)
GFR calc Af Amer: 100 mL/min/{1.73_m2} (ref >59)
GFR calc non Af Amer: 86 mL/min/{1.73_m2} (ref >59)
Glucose: 105 mg/dL — ABNORMAL HIGH (ref 65–99)
Potassium: 4.5 mmol/L (ref 3.5–5.2)
Sodium: 142 mmol/L (ref 134–144)

## 2019-06-01 MED ORDER — METOPROLOL TARTRATE 25 MG PO TABS: ORAL_TABLET | ORAL | 0 refills | Status: DC

## 2019-06-01 NOTE — Progress Notes (Signed)
Cardiology Office Note:    Date:  06/01/2019   ID:  Timothy Foster, DOB 11-10-1985, MRN [id number]  PCP:  [name], MD  Cardiologist:  [name], MD  Referring MD: [name], MD   Chief Complaint  Patient presents with   Follow-up   Chest Pain   Headache   Shortness of Breath    History of Present Illness:    Timothy Foster is a 33 y.o. male with a hx of hypertension, dyslipidemia, recurrent syncope who is seen as a new consult at the request of [name], MD for the evaluation and management of hypertension, chest pain.  Chest pain: -Initial onset: several weeks ago -Quality: mid chest, feels "almost like a bruise".  -Frequency: low level constant, intermittent flares -Duration: when flares, lasts less than an hour -Associated symptoms: shortness of breath. No nausea/diaphoresis. -Aggravating/alleviating factors: Worse when he lays down in bed. No other clear aggravating/alleviating factors. -Prior cardiac history: none -Prior workup: none -Prior treatment: none -Alcohol: about a beer/week -Tobacco: never -Comorbidities: hyperlipidemia, primarily triglycerides. Has been on fenofibrate for several years. TG in 2016 were 2292, has been upper 200s/lower 300s since then -Exercise level: feels presyncopal on occasion with exercise, last syncope several months ago, was related to exertion/being outdoors in the heat. Has only had syncope a few times in his life. Has been told he has issues with vasovagal syncope, happens with blood draws, stress, etc. -Cardiac ROS: no shortness of breath at rest, no PND, no orthopnea, no LE edema, no palpitations -Family history: dad had HTN, had CVA, tobacco use. Mom died age 93, unknown etiology, prior history of liver transplant/hepatitis C.  HTN: just recently started on losartan. Had never been on medication for HTN previously.   Other questions: numbness in bilateral arms intermittently. Not related  to other symptoms.  Patient notes that he feels dizzy/lightheaded at doctor's visits. Need to lie on the table today, felt like he might pass out/vomit. Under a lot of stress, best friend has metastatic melanoma. Symptoms resolved with resting.  Past Medical History:  Diagnosis Date   Allergy    Anxiety    Chicken pox    Depression    GERD (gastroesophageal reflux disease)    Headache(784.0)    Hyperlipidemia     Past Surgical History:  Procedure Laterality Date   WISDOM TOOTH EXTRACTION      Current Medications: Current Outpatient Medications on File Prior to Visit  Medication Sig   fenofibrate 160 MG tablet TAKE 1 TABLET DAILY (PLEASE COME IN FOR LABS FOR FURTHER REFILLS [phone])   fexofenadine (ALLEGRA) 180 MG tablet Take 1 tablet (180 mg total) by mouth daily.   fluticasone (FLONASE) 50 MCG/ACT nasal spray Place 2 sprays into both nostrils daily.   losartan (COZAAR) 50 MG tablet Take 1 tablet (50 mg total) by mouth daily.   omeprazole (PRILOSEC) 40 MG capsule TAKE 1 CAPSULE DAILY (SCHEDULE LAB APPOINTMENT FOR FURTHER REFILLS [phone])   traZODone (DESYREL) 50 MG tablet Take 1 tablet (50 mg total) by mouth at bedtime as needed for sleep.   venlafaxine XR (EFFEXOR-XR) 150 MG 24 hr capsule Take 1 capsule (150 mg total) by mouth daily.   No current facility-administered medications on file prior to visit.      Allergies:   Penicillins   Social History   Tobacco Use   Smoking status: Never Smoker   Smokeless tobacco: Never Used  Substance Use Topics   Alcohol use: Yes  Comment: couple times per month   Drug use: No    Family History: family history includes Alcohol abuse in his maternal uncle and paternal uncle; Cancer in his paternal uncle; Colon cancer in his paternal uncle; Crohn's disease in his maternal aunt; Diabetes in his father and mother; Esophageal cancer in his father; Pancreatic cancer in his paternal uncle; Stomach cancer in  his paternal grandfather; Stroke in his father.  ROS:   Please see the history of present illness.  Additional pertinent ROS: Constitutional: Negative for chills, fever, night sweats, unintentional weight loss  HENT: Negative for ear pain and hearing loss.   Eyes: Negative for loss of vision and eye pain.  Respiratory: Negative for cough, sputum, wheezing.   Cardiovascular: See HPI. Gastrointestinal: Negative for abdominal pain, melena, and hematochezia.  Genitourinary: Negative for dysuria and hematuria.  Musculoskeletal: Negative for falls and myalgias.  Skin: Negative for itching and rash.  Neurological: Negative for focal weakness, focal sensory changes and loss of consciousness.  Endo/Heme/Allergies: Does not bruise/bleed easily.     EKGs/Labs/Other Studies Reviewed:    The following studies were reviewed today: No prior cardiac studies  EKG:  EKG is personally reviewed.  The ekg ordered today demonstrates SR with artifact at 78 bpm  Recent Labs: 09/18/2018: ALT 49 06/01/2019: BUN 10; Creatinine, Ser 1.11; Potassium 4.5; Sodium 142  Recent Lipid Panel    Component Value Date/Time   CHOL 192 09/18/2018 0900   TRIG 280.0 (H) 09/18/2018 0900   HDL 28.10 (L) 09/18/2018 0900   CHOLHDL 7 09/18/2018 0900   VLDL 56.0 (H) 09/18/2018 0900   LDLDIRECT 138.0 09/18/2018 0900    Physical Exam:    VS:  BP 124/90 (BP Location: Right Arm, Patient Position: Sitting, Cuff Size: Normal)    Pulse 78    Temp 97.9 F (36.6 C)    Ht 5\' 10"  (1.778 m)    Wt 224 lb (101.6 kg)    BMI 32.14 kg/m    Orthostatic VS for the past 24 hrs (Last 3 readings):  BP- Lying Pulse- Lying BP- Sitting Pulse- Sitting BP- Standing at 0 minutes Pulse- Standing at 0 minutes BP- Standing at 3 minutes Pulse- Standing at 3 minutes  06/01/19 1050 143/80 79 135/84 86 132/86 85 (!) 138/92 87    Wt Readings from Last 3 Encounters:  06/01/19 224 lb (101.6 kg)  05/07/19 224 lb 3.2 oz (101.7 kg)  09/30/18 210 lb (95.3  kg)    GEN: Well nourished, well developed in no acute distress HEENT: Normal, moist mucous membranes NECK: No JVD CARDIAC: regular rhythm, normal S1 and S2, no rubs or gallops. No murmurs. VASCULAR: Radial and DP pulses 2+ bilaterally. No carotid bruits RESPIRATORY:  Clear to auscultation without rales, wheezing or rhonchi  ABDOMEN: Soft, non-tender, non-distended MUSCULOSKELETAL:  Ambulates independently SKIN: Warm and dry, no edema NEUROLOGIC:  Alert and oriented x 3. No focal neuro deficits noted. PSYCHIATRIC:  Normal affect    ASSESSMENT:    1. Precordial pain   2. Pre-procedure lab exam   3. Cardiac risk counseling   4. Counseling on health promotion and disease prevention   5. Essential hypertension   6. Hypertriglyceridemia    PLAN:    Chest pain: We spent significant time today reviewing different parts of the cardiovascular system (electrical, vascular, functional, and valvular). We discussed how each of these systems can present with different symptoms. We reviewed that there are different ways we evaluate these symptoms with tests. We reviewed  which tests I think are most appropriate given the symptoms, and we discussed risks/benefits and limitations of each of these tests. Please see summary below. We also discussed that if testing is unrevealing for a cardiac cause of the symptoms, there are many noncardiac causes as well that can contribute to symptoms. If the heart is ruled out, then I recommend returning to PCP to discuss alternative diagnoses.  -discussed treadmill stress, nuclear stress/lexiscan, and CT coronary angiography. Discussed pros and cons of each, including but not limited to false positive/false negative risk, radiation risk, and risk of IV contrast dye. Based on shared decision making, decision was made to pursue CT coronary angiography. -will give one time dose of metoprolol for heart rate control -counseled on need to get BMET prior to test -counseled  on use of sublingual nitroglycerin and its importance to a good test  Hypertension: at goal today, not orthostatic -continue losartan, will re-evaluate at follow up  Hypertriglyceridemia: based on results of CT, will discuss long term plan for regimen and management -reviewed diet/exercise recommendations, below -ok to continue fibrate for now  Cardiac risk counseling and prevention recommendations: -recommend heart healthy/Mediterranean diet, with whole grains, fruits, vegetable, fish, lean meats, nuts, and olive oil. Limit salt. -recommend moderate walking, 3-5 times/week for 30-50 minutes each session. Aim for at least 150 minutes.week. Goal should be pace of 3 miles/hours, or walking 1.5 miles in 30 minutes -recommend avoidance of tobacco products. Avoid excess alcohol. -Additional risk factor control:  -Diabetes risk: A1c is 5.3  -Lipids: as above  -Blood pressure control: as above  -Weight: BMI 32, discussed lifestyle as above -ASCVD risk score: The ASCVD Risk score Mikey Bussing DC Jr., et al., 2013) failed to calculate for the following reasons:   The 2013 ASCVD risk score is only valid for ages 64 to 73    Plan for follow up: 6 weeks  Medication Adjustments/Labs and Tests Ordered: Current medicines are reviewed at length with the patient today.  Concerns regarding medicines are outlined above.  Orders Placed This Encounter  Procedures   CT CORONARY MORPH W/CTA COR W/SCORE W/CA W/CM &/OR WO/CM   CT CORONARY FRACTIONAL FLOW RESERVE DATA PREP   CT CORONARY FRACTIONAL FLOW RESERVE FLUID ANALYSIS   Basic metabolic panel   EKG [id number]   Meds ordered this encounter  Medications   metoprolol tartrate (LOPRESSOR) 25 MG tablet    Sig: TAKE 1 TABLET 2 HR PRIOR TO CARDIAC PROCEDURE    Dispense:  1 tablet    Refill:  0    Patient Instructions  Medication Instructions:  Your Physician recommend you continue on your current medication as directed.    *If you need a refill on  your cardiac medications before your next appointment, please call your pharmacy*  Lab Work: Your physician recommends that you return for lab work today (BMP)  If you have labs (blood work) drawn today and your tests are completely normal, you will receive your results only by:  MyChart Message (if you have MyChart) OR  A paper copy in the mail If you have any lab test that is abnormal or we need to change your treatment, we will call you to review the results.  Testing/Procedures: Non-Cardiac CT Angiography (CTA), is a special type of CT scan that uses a computer to produce multi-dimensional views of major blood vessels throughout the body. In CT angiography, a contrast material is injected through an IV to help visualize the blood vessels [facility]  Follow-Up:  At [facility], you and your health needs are our priority.  As part of our continuing mission to provide you with exceptional heart care, we have created designated Provider Care Teams.  These Care Teams include your primary Cardiologist (physician) and Advanced Practice Providers (APPs -  Physician Assistants and Nurse Practitioners) who all work together to provide you with the care you need, when you need it.  Your next appointment:   6 weeks  The format for your next appointment:   In Person  Provider:   [name], MD  Your cardiac CT will be scheduled at one of the below locations:   [facility] [address] [place], [place] [address] [phone]   If scheduled at [facility], please arrive at the [facility] main entrance of [facility] 30-45 minutes prior to test start time. Proceed to the [facility] Radiology Department (first floor) to check-in and test prep.  If scheduled at [facility], please arrive 15 mins early for check-in and test prep.  Please follow these instructions carefully (unless otherwise directed):  Hold  all erectile dysfunction medications at least 3 days (72 hrs) prior to test.  On the Night Before the Test:  Be sure to Drink plenty of water.  Do not consume any caffeinated/decaffeinated beverages or chocolate 12 hours prior to your test.  Do not take any antihistamines 12 hours prior to your test.   On the Day of the Test:  Drink plenty of water. Do not drink any water within one hour of the test.  Do not eat any food 4 hours prior to the test.  You may take your regular medications prior to the test.   Take metoprolol (Lopressor) two hours prior to test.         After the Test:  Drink plenty of water.  After receiving IV contrast, you may experience a mild flushed feeling. This is normal.  On occasion, you may experience a mild rash up to 24 hours after the test. This is not dangerous. If this occurs, you can take Benadryl 25 mg and increase your fluid intake.  If you experience trouble breathing, this can be serious. If it is severe call 911 IMMEDIATELY. If it is mild, please call our office.  If you take any of these medications: Glipizide/Metformin, Avandament, Glucavance, please do not take 48 hours after completing test unless otherwise instructed.   Once we have confirmed authorization from your insurance company, we will call you to set up a date and time for your test.   For non-scheduling related questions, please contact the cardiac imaging nurse navigator should you have any questions/concerns: Marchia Bond, RN Navigator Cardiac Imaging [facility] [phone] Office       Signed, [name], MD PhD 06/01/2019 11:38 PM    [place]

## 2019-06-01 NOTE — Patient Instructions (Signed)
Medication Instructions:  Your Physician recommend you continue on your current medication as directed.    *If you need a refill on your cardiac medications before your next appointment, please call your pharmacy*  Lab Work: Your physician recommends that you return for lab work today (BMP)  If you have labs (blood work) drawn today and your tests are completely normal, you will receive your results only by: Marland Kitchen MyChart Message (if you have MyChart) OR . A paper copy in the mail If you have any lab test that is abnormal or we need to change your treatment, we will call you to review the results.  Testing/Procedures: Non-Cardiac CT Angiography (CTA), is a special type of CT scan that uses a computer to produce multi-dimensional views of major blood vessels throughout the body. In CT angiography, a contrast material is injected through an IV to help visualize the blood vessels [facility]  Follow-Up: At [facility] you and your health needs are our priority.  As part of our continuing mission to provide you with exceptional heart care, we have created designated Provider Care Teams.  These Care Teams include your primary Cardiologist (physician) and Advanced Practice Providers (APPs -  Physician Assistants and Nurse Practitioners) who all work together to provide you with the care you need, when you need it.  Your next appointment:   6 weeks  The format for your next appointment:   In Person  Provider:   [name], MD  Your cardiac CT will be scheduled at one of the below locations:   [facility] [address] [place], [place] [address] [phone]   If scheduled at [facility], please arrive at the [facility] main entrance of [facility] 30-45 minutes prior to test start time. Proceed to the [facility] Radiology Department (first floor) to check-in and test prep.  If scheduled at [facility],  please arrive 15 mins early for check-in and test prep.  Please follow these instructions carefully (unless otherwise directed):  Hold all erectile dysfunction medications at least 3 days (72 hrs) prior to test.  On the Night Before the Test: . Be sure to Drink plenty of water. . Do not consume any caffeinated/decaffeinated beverages or chocolate 12 hours prior to your test. . Do not take any antihistamines 12 hours prior to your test.   On the Day of the Test: . Drink plenty of water. Do not drink any water within one hour of the test. . Do not eat any food 4 hours prior to the test. . You may take your regular medications prior to the test.  . Take metoprolol (Lopressor) two hours prior to test.         After the Test: . Drink plenty of water. . After receiving IV contrast, you may experience a mild flushed feeling. This is normal. . On occasion, you may experience a mild rash up to 24 hours after the test. This is not dangerous. If this occurs, you can take Benadryl 25 mg and increase your fluid intake. . If you experience trouble breathing, this can be serious. If it is severe call 911 IMMEDIATELY. If it is mild, please call our office. . If you take any of these medications: Glipizide/Metformin, Avandament, Glucavance, please do not take 48 hours after completing test unless otherwise instructed.   Once we have confirmed authorization from your insurance company, we will call you to set up a date and time for  your test.   For non-scheduling related questions, please contact the cardiac imaging nurse navigator should you have any questions/concerns: Marchia Bond, RN Navigator Cardiac Imaging [facility] [phone] Office

## 2019-06-18 ENCOUNTER — Telehealth (HOSPITAL_COMMUNITY): Payer: Self-pay | Admitting: Emergency Medicine

## 2019-06-18 NOTE — Telephone Encounter (Signed)
Left message on voicemail with name and callback number Denali Sharma RN Navigator Cardiac Imaging [facility] [phone] Office [phone] Cell  

## 2019-06-20 ENCOUNTER — Other Ambulatory Visit: Payer: Self-pay

## 2019-06-20 ENCOUNTER — Encounter: Payer: BC Managed Care – PPO | Admitting: *Deleted

## 2019-06-20 ENCOUNTER — Encounter: Payer: Self-pay | Admitting: Family Medicine

## 2019-06-20 ENCOUNTER — Ambulatory Visit (HOSPITAL_COMMUNITY)
Admission: RE | Admit: 2019-06-20 | Discharge: 2019-06-20 | Disposition: A | Discharge status: Home / Self Care | Payer: BC Managed Care – PPO | Source: Ambulatory Visit | Attending: Cardiology | Admitting: Cardiology

## 2019-06-20 DIAGNOSIS — Z006 Encounter for examination for normal comparison and control in clinical research program: Secondary | ICD-10-CM

## 2019-06-20 DIAGNOSIS — R072 Precordial pain: Secondary | ICD-10-CM

## 2019-06-20 MED ORDER — METOPROLOL TARTRATE 5 MG/5ML IV SOLN: 5.0000 mg | INTRAVENOUS | Status: DC | PRN

## 2019-06-20 MED ORDER — IOHEXOL 350 MG/ML SOLN
100.0000 mL | Freq: Once | INTRAVENOUS | Status: AC | PRN
  Administered 2019-06-20: 95 mL via INTRAVENOUS

## 2019-06-20 MED ORDER — NITROGLYCERIN 0.4 MG SL SUBL
0.8000 mg | SUBLINGUAL_TABLET | SUBLINGUAL | Status: DC | PRN
  Administered 2019-06-20: 10:00:00 0.8 mg via SUBLINGUAL

## 2019-06-20 MED ORDER — NITROGLYCERIN 0.4 MG SL SUBL
SUBLINGUAL_TABLET | SUBLINGUAL | Status: AC
  Administered 2019-06-20: 0.8 mg via SUBLINGUAL
  Filled 2019-06-20: qty 2

## 2019-06-20 NOTE — Progress Notes (Signed)
Please see flow sheet for documentation of vital signs. After the IV was inserted the patient became pale he states "this happens from time to time, I just need to lay back" He was assisted in the recliner to trendelenburg prior to going to the [place]. Pulse 68 prior to CT.   During the CT heart study the patient became diaphoretic and pale, he reports nausea. Interventions made assisted with breathing techniques, he remains conscious alert and oriented x4. BP post nitroglycerin SL 78/60. 250 ml bolus given. BP and symptoms decreased. Pt placed on stretcher post study. He is recovering in IR. No further interventions needed at this time.

## 2019-06-20 NOTE — Research (Signed)
CADFEM Informed Consent                  Subject Name:   Timothy Foster   Subject met inclusion and exclusion criteria.  The informed consent form, study requirements and expectations were reviewed with the subject and questions and concerns were addressed prior to the signing of the consent form.  The subject verbalized understanding of the trial requirements.  The subject agreed to participate in the CADFEM trial and signed the informed consent.  The informed consent was obtained prior to performance of any protocol-specific procedures for the subject.  A copy of the signed informed consent was given to the subject and a copy was placed in the subject's medical record.   [place] Chalmers, Research Assistant  06/20/2019 08;36

## 2019-06-22 DIAGNOSIS — M7542 Impingement syndrome of left shoulder: Secondary | ICD-10-CM | POA: Diagnosis not present

## 2019-06-22 DIAGNOSIS — M9901 Segmental and somatic dysfunction of cervical region: Secondary | ICD-10-CM | POA: Diagnosis not present

## 2019-06-22 DIAGNOSIS — M9902 Segmental and somatic dysfunction of thoracic region: Secondary | ICD-10-CM | POA: Diagnosis not present

## 2019-06-22 DIAGNOSIS — M7918 Myalgia, other site: Secondary | ICD-10-CM | POA: Diagnosis not present

## 2019-06-25 ENCOUNTER — Telehealth: Payer: Self-pay | Admitting: Cardiology

## 2019-06-25 NOTE — Telephone Encounter (Signed)
Pt updated with results and verbalized understanding.

## 2019-06-25 NOTE — Telephone Encounter (Signed)
Patient calling back for results.   Thank you!   

## 2019-06-25 NOTE — Telephone Encounter (Signed)
New Message  Pt is calling back for results   Please call  

## 2019-07-02 ENCOUNTER — Other Ambulatory Visit: Payer: Self-pay

## 2019-07-02 ENCOUNTER — Telehealth (INDEPENDENT_AMBULATORY_CARE_PROVIDER_SITE_OTHER): Payer: BC Managed Care – PPO | Admitting: Family Medicine

## 2019-07-02 DIAGNOSIS — D485 Neoplasm of uncertain behavior of skin: Secondary | ICD-10-CM | POA: Diagnosis not present

## 2019-07-02 DIAGNOSIS — D225 Melanocytic nevi of trunk: Secondary | ICD-10-CM | POA: Diagnosis not present

## 2019-07-02 DIAGNOSIS — G43009 Migraine without aura, not intractable, without status migrainosus: Secondary | ICD-10-CM | POA: Diagnosis not present

## 2019-07-02 DIAGNOSIS — Z1283 Encounter for screening for malignant neoplasm of skin: Secondary | ICD-10-CM | POA: Diagnosis not present

## 2019-07-02 MED ORDER — PREDNISONE 20 MG PO TABS: ORAL_TABLET | ORAL | 0 refills | Status: DC

## 2019-07-02 MED ORDER — SUMATRIPTAN SUCCINATE 100 MG PO TABS: ORAL_TABLET | ORAL | 2 refills | Status: DC

## 2019-07-02 NOTE — Progress Notes (Signed)
This visit type was conducted due to national recommendations for restrictions regarding the COVID-19 pandemic in an effort to limit this patient's exposure and mitigate transmission in our community.   Virtual Visit via Video Note  I connected with Timothy Foster on 07/02/19 at  4:30 PM EST by a video enabled telemedicine application and verified that I am speaking with the correct person using two identifiers.  Location patient: home Location provider:work or home office Persons participating in the virtual visit: patient, provider  I discussed the limitations of evaluation and management by telemedicine and the availability of in person appointments. The patient expressed understanding and agreed to proceed.   HPI: Timothy Foster called to discuss headaches.  He had fairly severe headache which lasted a few days back prior to Thanksgiving.  He then had recurrent headache around 3 AM this morning.  Headache woke him up from sleep.  This was right sided retro-orbital.  He had some nausea and episode of vomiting.  He describes a throbbing headache with some photosensitivity.  No fever.  No stiff neck.  Took 2 Advil with some improvement.  He does not have any known definitive history of migraine headaches but there is some question whether he may have had some migraine headaches in childhood.  His mother does have history of migraine headaches.  He has noticed some correlation with headaches with weather system changes.  No aura.  He does have hypertension and states blood pressure earlier today 140/81.  He is not aware of any definite food triggers.  He does have some residual headache today and some of his similar headaches in the past have lingered for a few days.  He does not recall trying triptan medications previously.  Denies any visual changes, stiff neck, fever, focal weakness, confusion, seizure, or any exertional headache.  He states he has had about 2/month over the past couple months   ROS:  See pertinent positives and negatives per HPI.  Past Medical History:  Diagnosis Date  . Allergy   . Anxiety   . Chicken pox   . Depression   . GERD (gastroesophageal reflux disease)   . Headache(784.0)   . Hyperlipidemia     Past Surgical History:  Procedure Laterality Date  . WISDOM TOOTH EXTRACTION      Family History  Problem Relation Age of Onset  . Diabetes Mother   . Stroke Father   . Diabetes Father   . Esophageal cancer Father   . Alcohol abuse Maternal Uncle   . Alcohol abuse Paternal Uncle   . Cancer Paternal Uncle   . Pancreatic cancer Paternal Uncle   . Colon cancer Paternal Uncle   . Crohn's disease Maternal Aunt   . Stomach cancer Paternal Grandfather     SOCIAL HX: Non-smoker.   Current Outpatient Medications:  .  fenofibrate 160 MG tablet, TAKE 1 TABLET DAILY (PLEASE COME IN FOR LABS FOR FURTHER REFILLS [phone]), Disp: 90 tablet, Rfl: 3 .  fexofenadine (ALLEGRA) 180 MG tablet, Take 1 tablet (180 mg total) by mouth daily., Disp: 30 tablet, Rfl: 1 .  fluticasone (FLONASE) 50 MCG/ACT nasal spray, Place 2 sprays into both nostrils daily., Disp: 1 g, Rfl: 0 .  losartan (COZAAR) 50 MG tablet, Take 1 tablet (50 mg total) by mouth daily., Disp: 30 tablet, Rfl: 11 .  metoprolol tartrate (LOPRESSOR) 25 MG tablet, TAKE 1 TABLET 2 HR PRIOR TO CARDIAC PROCEDURE, Disp: 1 tablet, Rfl: 0 .  omeprazole (PRILOSEC) 40 MG capsule, TAKE 1  CAPSULE DAILY (SCHEDULE LAB APPOINTMENT FOR FURTHER REFILLS [phone]), Disp: 90 capsule, Rfl: 1 .  predniSONE (DELTASONE) 20 MG tablet, Take 2 tablets once daily for 5 days, Disp: 10 tablet, Rfl: 0 .  SUMAtriptan (IMITREX) 100 MG tablet, Take 1 tablet at onset of migraine and may repeat 1 in 2 hours as needed for persistent headache but no more than 2 in 24 hours, Disp: 10 tablet, Rfl: 2 .  traZODone (DESYREL) 50 MG tablet, Take 1 tablet (50 mg total) by mouth at bedtime as needed for sleep., Disp: 30 tablet, Rfl: 6 .  venlafaxine  XR (EFFEXOR-XR) 150 MG 24 hr capsule, Take 1 capsule (150 mg total) by mouth daily., Disp: 90 capsule, Rfl: 3  EXAM:  VITALS per patient if applicable:  GENERAL: alert, oriented, appears well and in no acute distress  HEENT: atraumatic, conjunttiva clear, no obvious abnormalities on inspection of external nose and ears  NECK: normal movements of the head and neck  LUNGS: on inspection no signs of respiratory distress, breathing rate appears normal, no obvious gross SOB, gasping or wheezing  CV: no obvious cyanosis  MS: moves all visible extremities without noticeable abnormality  PSYCH/NEURO: pleasant and cooperative, no obvious depression or anxiety, speech and thought processing grossly intact  ASSESSMENT AND PLAN:  Discussed the following assessment and plan:  Acute, episodic unilateral headache.  Suspect migraine without aura Possible trigger is changes in barometric pressure.  Features that suggest migraine include duration, unilateral, associated nausea and photosensitivity and worsening with activity.  Also has positive family history as above  -Discussed use of triptan medication for acute treatment but explained this will probably not work well for this episode.  We recommend Imitrex 100 mg tablets 1 at onset of migraine and may repeat 1 in 2 hours as needed.  No more than 2 in 24 hours  -Given the fact has had some persistent headaches over the past few episodes and the fact this was several hours old we have recommended prednisone 20 mg 2 tablets once daily for 5 days  -Set up 2-week office follow-up to reassess.  We will further discuss possible triggers at that point.     I discussed the assessment and treatment plan with the patient. The patient was provided an opportunity to ask questions and all were answered. The patient agreed with the plan and demonstrated an understanding of the instructions.   The patient was advised to call back or seek an in-person  evaluation if the symptoms worsen or if the condition fails to improve as anticipated.   Carolann Littler, MD

## 2019-07-09 ENCOUNTER — Telehealth (INDEPENDENT_AMBULATORY_CARE_PROVIDER_SITE_OTHER): Payer: BC Managed Care – PPO | Admitting: Cardiology

## 2019-07-09 ENCOUNTER — Telehealth: Payer: Self-pay | Admitting: Cardiology

## 2019-07-09 ENCOUNTER — Encounter: Payer: Self-pay | Admitting: Cardiology

## 2019-07-09 VITALS — BP 140/89 | HR 116 | Ht 71.0 in | Wt 220.0 lb

## 2019-07-09 DIAGNOSIS — R0789 Other chest pain: Secondary | ICD-10-CM

## 2019-07-09 DIAGNOSIS — R072 Precordial pain: Secondary | ICD-10-CM

## 2019-07-09 DIAGNOSIS — I10 Essential (primary) hypertension: Secondary | ICD-10-CM

## 2019-07-09 DIAGNOSIS — E781 Pure hyperglyceridemia: Secondary | ICD-10-CM | POA: Diagnosis not present

## 2019-07-09 DIAGNOSIS — E785 Hyperlipidemia, unspecified: Secondary | ICD-10-CM | POA: Diagnosis not present

## 2019-07-09 DIAGNOSIS — Z712 Person consulting for explanation of examination or test findings: Secondary | ICD-10-CM

## 2019-07-09 MED ORDER — OMEGA-3-ACID ETHYL ESTERS 1 G PO CAPS: 1.0000 g | ORAL_CAPSULE | Freq: Two times a day (BID) | ORAL | 11 refills | Status: DC

## 2019-07-09 NOTE — Telephone Encounter (Signed)
-----   Message from Meryl Crutch, RN sent at 07/09/2019  9:08 AM EST ----- Regarding: appointment 3 month f/u with Dr. Harrell Gave. Can be virtual or in person.

## 2019-07-09 NOTE — Telephone Encounter (Signed)
LMTCB to schedule follow up appt in 3 months, see note below.

## 2019-07-09 NOTE — Patient Instructions (Signed)
Medication Instructions:  Start: Lovaza 1 g twice a day  *If you need a refill on your cardiac medications before your next appointment, please call your pharmacy*  Lab Work: Your physician recommends that you return for lab work in 3 months ( fasting lipids)  If you have labs (blood work) drawn today and your tests are completely normal, you will receive your results only by: Marland Kitchen MyChart Message (if you have MyChart) OR . A paper copy in the mail If you have any lab test that is abnormal or we need to change your treatment, we will call you to review the results.  Testing/Procedures: None  Follow-Up: At [facility], you and your health needs are our priority.  As part of our continuing mission to provide you with exceptional heart care, we have created designated Provider Care Teams.  These Care Teams include your primary Cardiologist (physician) and Advanced Practice Providers (APPs -  Physician Assistants and Nurse Practitioners) who all work together to provide you with the care you need, when you need it.  Your next appointment:   3 month(s)  The format for your next appointment:   Either In Person or Virtual  Provider:   [name], MD

## 2019-07-09 NOTE — Progress Notes (Signed)
Virtual Visit via Video Note   This visit type was conducted due to national recommendations for restrictions regarding the COVID-19 Pandemic (e.g. social distancing) in an effort to limit this patient's exposure and mitigate transmission in our community.  Due to his co-morbid illnesses, this patient is at least at moderate risk for complications without adequate follow up.  This format is felt to be most appropriate for this patient at this time.  All issues noted in this document were discussed and addressed.  A limited physical exam was performed with this format.  Please refer to the patient's chart for his consent to telehealth for [facility].   Date:  07/09/2019   ID:  Timothy Foster, DOB 1986/03/28, MRN [id number]  Patient Location: Home Provider Location: Home  PCP:  [name], MD  Cardiologist:  [name], MD  Electrophysiologist:  None   Evaluation Performed:  Follow-Up Visit  Chief Complaint:  Follow up  History of Present Illness:    Timothy Foster is a 33 y.o. male with hypertension, dyslipidemia/hypertriglyceridemia, recurrent syncope, and chest pain.  The patient does not have symptoms concerning for COVID-19 infection (fever, chills, cough, or new shortness of breath).   Today: Chest pain mostly resolved. Only occurs with stress. Reviewed results of his CT together, very reassuring. No calcium, no CAD. Long term prognosis is excellent, though we still reviewed red flag warning signs that need immediate medical attention.  Has been checking BP intermittently at home. 140/89 this AM. Has seen as low as 120/80, highest in 150s/90s (though improves with resting).   Discussed TG, options for management. Reviewed role of fibrates, statins, omega3, diet, and exercise, see below.  Denies shortness of breath at rest or with normal exertion. No PND, orthopnea, LE edema or unexpected weight gain. No recent syncope or palpitations. Did feel presyncopal  when IV placed/nitro given for CT scan.   Past Medical History:  Diagnosis Date  . Allergy   . Anxiety   . Chicken pox   . Depression   . GERD (gastroesophageal reflux disease)   . Headache(784.0)   . Hyperlipidemia    Past Surgical History:  Procedure Laterality Date  . WISDOM TOOTH EXTRACTION       Current Meds  Medication Sig  . fenofibrate 160 MG tablet TAKE 1 TABLET DAILY (PLEASE COME IN FOR LABS FOR FURTHER REFILLS [phone])  . fexofenadine (ALLEGRA) 180 MG tablet Take 1 tablet (180 mg total) by mouth daily.  . fluticasone (FLONASE) 50 MCG/ACT nasal spray Place into both nostrils daily as needed for allergies or rhinitis.  Marland Kitchen losartan (COZAAR) 50 MG tablet Take 1 tablet (50 mg total) by mouth daily.  Marland Kitchen omeprazole (PRILOSEC) 40 MG capsule TAKE 1 CAPSULE DAILY (SCHEDULE LAB APPOINTMENT FOR FURTHER REFILLS [phone])  . SUMAtriptan (IMITREX) 100 MG tablet Take 1 tablet at onset of migraine and may repeat 1 in 2 hours as needed for persistent headache but no more than 2 in 24 hours  . traZODone (DESYREL) 50 MG tablet Take 1 tablet (50 mg total) by mouth at bedtime as needed for sleep.  Marland Kitchen venlafaxine XR (EFFEXOR-XR) 150 MG 24 hr capsule Take 1 capsule (150 mg total) by mouth daily.     Allergies:   Penicillins   Social History   Tobacco Use  . Smoking status: Never Smoker  . Smokeless tobacco: Never Used  Substance Use Topics  . Alcohol use: Yes    Comment: couple times per month  . Drug use:  No     Family Hx: The patient's family history includes Alcohol abuse in his maternal uncle and paternal uncle; Cancer in his paternal uncle; Colon cancer in his paternal uncle; Crohn's disease in his maternal aunt; Diabetes in his father and mother; Esophageal cancer in his father; Pancreatic cancer in his paternal uncle; Stomach cancer in his paternal grandfather; Stroke in his father.  ROS:   Please see the history of present illness.    All other systems reviewed and  are negative.   Prior CV studies:   The following studies were reviewed today: Coronary CTA, reviewed with him today  Labs/Other Tests and Data Reviewed:    EKG:  An ECG dated 06/01/19 was personally reviewed today and demonstrated:  NSR, artifact  Recent Labs: 09/18/2018: ALT 49 06/01/2019: BUN 10; Creatinine, Ser 1.11; Potassium 4.5; Sodium 142   Recent Lipid Panel Lab Results  Component Value Date/Time   CHOL 192 09/18/2018 09:00 AM   TRIG 280.0 (H) 09/18/2018 09:00 AM   HDL 28.10 (L) 09/18/2018 09:00 AM   CHOLHDL 7 09/18/2018 09:00 AM   LDLDIRECT 138.0 09/18/2018 09:00 AM    Wt Readings from Last 3 Encounters:  07/09/19 220 lb (99.8 kg)  06/01/19 224 lb (101.6 kg)  05/07/19 224 lb 3.2 oz (101.7 kg)     Objective:    Vital Signs:  BP 140/89   Pulse (!) 116   Ht 5\' 11"  (1.803 m)   Wt 220 lb (99.8 kg)   BMI 30.68 kg/m    VITAL SIGNS:  reviewed GEN:  no acute distress EYES:  sclerae anicteric, EOMI - Extraocular Movements Intact RESPIRATORY:  normal respiratory effort, symmetric expansion CARDIOVASCULAR:  no visible JVD SKIN:  no rash, lesions or ulcers. MUSCULOSKELETAL:  no obvious deformities. NEURO:  alert and oriented x 3, no obvious focal deficit PSYCH:  normal affect  ASSESSMENT & PLAN:    Chest pain: improved, CCTA very reassuring. We discussed his test results today. -discussed noncardiac etiologies -counseled on red flag warning signs that need immediate medical attention  Hypertension: home range largely normal, though did discuss that if his BP trends up he may need to increase dose -not orthostatic on prior exam -continue losartan  Dyslipidemia/hypertriglyceridemia: much improved on fibrate but would ideally like even lower -continue fibrate -discussed statin, omega3 (lovaza and vascepa), diet, and exercise -he will try lovaza, recheck fasting lipid panel in 3 mos  COVID-19 Education: The signs and symptoms of COVID-19 were discussed with  the patient and how to seek care for testing (follow up with PCP or arrange E-visit).  The importance of social distancing was discussed today.  Time:   Today, I have spent 18 minutes with the patient with telehealth technology discussing the above problems.     Medication Adjustments/Labs and Tests Ordered: Current medicines are reviewed at length with the patient today.  Concerns regarding medicines are outlined above.   Tests Ordered: Orders Placed This Encounter  Procedures  . Lipid panel    Medication Changes: Meds ordered this encounter  Medications  . omega-3 acid ethyl esters (LOVAZA) 1 g capsule    Sig: Take 1 capsule (1 g total) by mouth 2 (two) times daily.    Dispense:  60 capsule    Refill:  11   Patient Instructions  Medication Instructions:  Start: Lovaza 1 g twice a day  *If you need a refill on your cardiac medications before your next appointment, please call your pharmacy*  Lab Work: Your  physician recommends that you return for lab work in 3 months ( fasting lipids)  If you have labs (blood work) drawn today and your tests are completely normal, you will receive your results only by: Marland Kitchen MyChart Message (if you have MyChart) OR . A paper copy in the mail If you have any lab test that is abnormal or we need to change your treatment, we will call you to review the results.  Testing/Procedures: None  Follow-Up: At [facility] you and your health needs are our priority.  As part of our continuing mission to provide you with exceptional heart care, we have created designated Provider Care Teams.  These Care Teams include your primary Cardiologist (physician) and Advanced Practice Providers (APPs -  Physician Assistants and Nurse Practitioners) who all work together to provide you with the care you need, when you need it.  Your next appointment:   3 month(s)  The format for your next appointment:   Either In Person or Virtual  Provider:   [name], MD      Signed, [name], MD  07/09/2019 10:26 PM    [place]

## 2019-07-11 ENCOUNTER — Other Ambulatory Visit: Payer: Self-pay | Admitting: Family Medicine

## 2019-07-11 DIAGNOSIS — F5104 Psychophysiologic insomnia: Secondary | ICD-10-CM

## 2019-07-13 ENCOUNTER — Ambulatory Visit: Payer: BC Managed Care – PPO | Admitting: Cardiology

## 2019-07-18 ENCOUNTER — Telehealth: Payer: Self-pay

## 2019-07-18 NOTE — Telephone Encounter (Signed)
Prior authorization for Lovaza  has been approved through 07/17/22

## 2019-07-19 DIAGNOSIS — D485 Neoplasm of uncertain behavior of skin: Secondary | ICD-10-CM | POA: Diagnosis not present

## 2019-07-19 DIAGNOSIS — L988 Other specified disorders of the skin and subcutaneous tissue: Secondary | ICD-10-CM | POA: Diagnosis not present

## 2019-07-31 ENCOUNTER — Other Ambulatory Visit: Payer: Self-pay

## 2019-07-31 ENCOUNTER — Ambulatory Visit (INDEPENDENT_AMBULATORY_CARE_PROVIDER_SITE_OTHER): Payer: BC Managed Care – PPO | Admitting: Family Medicine

## 2019-07-31 ENCOUNTER — Encounter: Payer: Self-pay | Admitting: Family Medicine

## 2019-07-31 VITALS — BP 120/78 | HR 94 | Temp 97.9°F | Ht 70.0 in | Wt 224.0 lb

## 2019-07-31 DIAGNOSIS — G43009 Migraine without aura, not intractable, without status migrainosus: Secondary | ICD-10-CM

## 2019-07-31 DIAGNOSIS — G43109 Migraine with aura, not intractable, without status migrainosus: Secondary | ICD-10-CM | POA: Insufficient documentation

## 2019-07-31 NOTE — Progress Notes (Signed)
  Subjective:     Patient ID: Timothy Foster, male   DOB: 1986/01/17, 33 y.o.   MRN: [id number]  HPI Timothy Foster is seen to follow-up regarding recent visit for headaches.  He describes what sounded like typical migraine headache.  He had several features consistent with migraine (unilateral/retro-orbital, associated nausea, photosensitivity, worsening with activity, throbbing quality).  He had had several hours of headache at that point and we sent in 5 days of prednisone his headache did promptly relieved after starting the prednisone.  He has not had any headaches since then to try the Imitrex.  There is some equivocal history of possible migraine type headache in childhood but none as an adult until this most recent episode.  Past Medical History:  Diagnosis Date  . Allergy   . Anxiety   . Chicken pox   . Depression   . GERD (gastroesophageal reflux disease)   . Headache(784.0)   . Hyperlipidemia    Past Surgical History:  Procedure Laterality Date  . WISDOM TOOTH EXTRACTION      reports that he has never smoked. He has never used smokeless tobacco. He reports current alcohol use. He reports that he does not use drugs. family history includes Alcohol abuse in his maternal uncle and paternal uncle; Cancer in his paternal uncle; Colon cancer in his paternal uncle; Crohn's disease in his maternal aunt; Diabetes in his father and mother; Esophageal cancer in his father; Pancreatic cancer in his paternal uncle; Stomach cancer in his paternal grandfather; Stroke in his father. Allergies  Allergen Reactions  . Penicillins     Hives as a child     Review of Systems  Constitutional: Negative for chills and fever.  Respiratory: Negative for cough and shortness of breath.   Cardiovascular: Negative for chest pain.  Neurological: Negative for dizziness and syncope.  Hematological: Negative for adenopathy.  Psychiatric/Behavioral: Negative for confusion.       Objective:   Physical  Exam Vitals reviewed.  Constitutional:      Appearance: Normal appearance.  Eyes:     Pupils: Pupils are equal, round, and reactive to light.  Cardiovascular:     Rate and Rhythm: Normal rate and regular rhythm.  Pulmonary:     Effort: Pulmonary effort is normal.     Breath sounds: Normal breath sounds.  Musculoskeletal:     Cervical back: Neck supple.  Lymphadenopathy:     Cervical: No cervical adenopathy.  Neurological:     General: No focal deficit present.     Mental Status: He is alert and oriented to person, place, and time.     Cranial Nerves: No cranial nerve deficit.        Assessment:     Recent acute unilateral headache consistent with likely migraine without aura.  Doing well at this time    Plan:     -Discussed potential triggers for migraine headache with handout given -He has Imitrex to try at acute onset of headache -He will let us know if Imitrex is not promptly relieving any future headaches. -We also reviewed red flags to watch out for for any atypical type headache  [name] MD [facility]

## 2019-07-31 NOTE — Patient Instructions (Signed)
Recurrent Migraine Headache    Migraines are a type of headache, and they are usually stronger and more sudden than normal headaches (tension headaches). Migraines are characterized by an intense pulsing, throbbing pain that is usually only present on one side of the head. Sometimes, migraine headaches can cause nausea, vomiting, sensitivity to light and sound, and vision changes. Recurrent migraines keep coming back (recurring). A migraine can last from 4 hours up to 3 days.  What are the causes?  The exact cause of this condition is not known. However, a migraine may be caused when nerves in the brain become irritated and release chemicals that cause inflammation of blood vessels. This inflammation causes pain.  Certain things may also trigger migraines, such as:  · A disruption in your regular eating and sleeping schedule.  · Smoking.  · Stress.  · Menstruation.  · Certain foods and drinks, such as:  ? Aged cheese.  ? Chocolate.  ? Alcohol.  ? Caffeine.  ? Foods or drinks that contain nitrates, glutamate, aspartame, MSG, or tyramine.  · Lack of sleep.  · Hunger.  · Physical exertion.  · Fatigue.  · High altitude.  · Weather changes.  · Medicines, such as:  ? Nitroglycerin, which is used to treat chest pain.  ? Birth control pills.  ? Estrogen.  ? Some blood pressure medicines.  What are the signs or symptoms?  Symptoms of this condition vary for each person and may include:  · Pain that is usually only present on one side of the head. In some cases, the pain may be on both sides of the head or around the head or neck.  · Pulsating or throbbing pain.  · Severe pain that prevents daily activities.  · Pain that is aggravated by any physical activity.  · Nausea, vomiting, or both.  · Dizziness.  · Pain with exposure to bright lights, loud noises, or activity.  · General sensitivity to bright lights, loud noises, or smells.  Before you get a migraine, you may get warning signs that a migraine is coming (aura). An aura  may include:  · Seeing flashing lights.  · Seeing bright spots, halos, or zigzag lines.  · Having tunnel vision or blurred vision.  · Having numbness or a tingling feeling.  · Having trouble talking.  · Having muscle weakness.  · Smelling a certain odor.  How is this diagnosed?  This condition is often diagnosed based on:  · Your symptoms and medical history.  · A physical exam.  You may also have tests, including:  · A CT scan or MRI of your brain. These imaging tests cannot diagnose migraines, but they can help to rule out other causes of headaches.  · Blood tests.  How is this treated?  This condition is treated with:  · Medicines. These are used for:  ? Lessening pain and nausea.  ? Preventing recurrent migraines.  · Lifestyle changes, such as changes to your diet or sleeping patterns.  · Behavior therapy, such as relaxation training or biofeedback. Biofeedback is a treatment that involves teaching you to relax and use your brain to lower your heart rate and control your breathing.  Follow these instructions at home:  Medicines  · Take over-the-counter and prescription medicines only as told by your health care provider.  · Do not drive or use heavy machinery while taking prescription pain medicine.  Lifestyle  · Do not use any products that contain nicotine or tobacco, such as   sleep recommended by your health care provider.  Limit your stress. Talk with your health care provider if you need help with stress management.  Maintain a healthy weight. If you need help losing weight, ask your health care provider.  Exercise regularly. Aim for 150 minutes of moderate-intensity exercise (walking,  biking, yoga) or 75 minutes of vigorous exercise (running, circuit training, swimming) each week. General instructions   Keep a journal to find out what triggers your migraine headaches so you can avoid these triggers. For example, write down: ? What you eat and drink. ? How much sleep you get. ? Any change to your diet or medicines.  Lie down in a dark, quiet room when you have a migraine.  Try placing a cool towel over your head when you have a migraine.  Keep lights dim, if bright lights bother you and make your migraines worse.  Keep all follow-up visits as told by your health care provider. This is important. Contact a health care provider if:  Your pain does not improve, even with medicine.  Your migraines continue to return, even with medicine.  You have a fever.  You have weight loss. Get help right away if:  Your migraine becomes severe and medicine does not help.  You have a stiff neck.  You have a loss of vision.  You have muscle weakness or loss of muscle control.  You start losing your balance or have trouble walking.  You feel faint or you pass out.  You develop new, severe symptoms.  You start having abrupt severe headaches that last for a second or less, like a thunderclap. Summary  Migraine headaches are usually stronger and more sudden than normal headaches (tension headaches). Migraines are characterized by an intense pulsing, throbbing pain that is usually only present on one side of the head.  The exact cause of this condition is not known. However, a migraine may be caused when nerves in the brain become irritated and release chemicals that cause inflammation of blood vessels.  Certain things may trigger migraines, such as changes to diet or sleeping patterns, smoking, certain foods, alcohol, stress, and certain medicines.  Sometimes, migraine headaches can cause nausea, vomiting, sensitivity to light and sound, and vision changes.  Migraines  are often diagnosed based on your symptoms, medical history, and a physical exam. This information is not intended to replace advice given to you by your health care provider. Make sure you discuss any questions you have with your health care provider. Document Released: 04/13/2001 Document Revised: 07/22/2017 Document Reviewed: 04/30/2016 Elsevier Patient Education  2020 [employer].

## 2019-08-19 DIAGNOSIS — Z20828 Contact with and (suspected) exposure to other viral communicable diseases: Secondary | ICD-10-CM | POA: Diagnosis not present

## 2019-08-19 DIAGNOSIS — Z03818 Encounter for observation for suspected exposure to other biological agents ruled out: Secondary | ICD-10-CM | POA: Diagnosis not present

## 2019-08-27 ENCOUNTER — Encounter: Payer: Self-pay | Admitting: Family Medicine

## 2019-08-29 ENCOUNTER — Encounter: Payer: Self-pay | Admitting: Family Medicine

## 2019-08-29 ENCOUNTER — Ambulatory Visit (INDEPENDENT_AMBULATORY_CARE_PROVIDER_SITE_OTHER): Payer: BC Managed Care – PPO | Admitting: Family Medicine

## 2019-08-29 ENCOUNTER — Other Ambulatory Visit: Payer: Self-pay

## 2019-08-29 VITALS — BP 130/82 | HR 103 | Temp 98.0°F | Ht 70.0 in | Wt 225.6 lb

## 2019-08-29 DIAGNOSIS — M25561 Pain in right knee: Secondary | ICD-10-CM | POA: Diagnosis not present

## 2019-08-29 MED ORDER — MELOXICAM 15 MG PO TABS: 15.0000 mg | ORAL_TABLET | Freq: Every day | ORAL | 1 refills | Status: DC

## 2019-08-29 NOTE — Progress Notes (Signed)
  Subjective:     Patient ID: Timothy Foster, male   DOB: 17-Jul-1986, 34 y.o.   MRN: [id number]  HPI   Timothy Foster had called yesterday with some right knee pain.  He states it was exactly a year ago he had similar pain and apparently had some fluid drawn off in urgent care.  He denies any injury.  He noticed swelling of the we can in some pain when he had this event and flexion.  He states his pain is actually somewhat better today.  He also notices less swelling.  Is not any recent redness or warmth.  No history of gout or pseudogout.  No locking or giving way.  Past Medical History:  Diagnosis Date  . Allergy   . Anxiety   . Chicken pox   . Depression   . GERD (gastroesophageal reflux disease)   . Headache(784.0)   . Hyperlipidemia    Past Surgical History:  Procedure Laterality Date  . WISDOM TOOTH EXTRACTION      reports that he has never smoked. He has never used smokeless tobacco. He reports current alcohol use. He reports that he does not use drugs. family history includes Alcohol abuse in his maternal uncle and paternal uncle; Cancer in his paternal uncle; Colon cancer in his paternal uncle; Crohn's disease in his maternal aunt; Diabetes in his father and mother; Esophageal cancer in his father; Pancreatic cancer in his paternal uncle; Stomach cancer in his paternal grandfather; Stroke in his father. Allergies  Allergen Reactions  . Penicillins Anaphylaxis    Hives as a child Hives as a child     Review of Systems  Constitutional: Negative for chills and fever.  Neurological: Negative for weakness and numbness.       Objective:   Physical Exam Vitals reviewed.  Constitutional:      Appearance: Normal appearance.  Cardiovascular:     Rate and Rhythm: Normal rate and regular rhythm.  Musculoskeletal:     Comments: Right knee reveals full range of motion.  No effusion.  No warmth.  No erythema.  Mild lateral joint line tenderness.  Collateral ligament testing is normal.   Anterior and posterior cruciate ligament testing is normal.  Neurological:     Mental Status: He is alert.        Assessment:     Right knee pain.  Slightly improved past couple days.  No evidence for significant effusion on exam at this time.  Question lateral meniscus injury    Plan:     -Avoid frequent squatting -Recommend trial of meloxicam 15 mg once daily and take with food -Touch base if this does not continue to improve over the next several weeks.  Consider sports medicine referral if not completely better in a few weeks  [name] MD [facility] at [facility]

## 2019-08-29 NOTE — Patient Instructions (Signed)
Knee Effusion Knee effusion refers to excess fluid in the knee joint. This can cause pain and swelling in your knee. Knee effusion creates more pressure than usual in your knee joint. This makes it more difficult for you to bend and move your knee. If there is fluid in your knee, it often means that something is wrong inside your knee. This can be a result of:  Severe arthritis.  Injury to the knee muscles, ligaments, or cartilage.  Infection.  Autoimmune disease. This means that your body's defense system (immune system) mistakenly attacks healthy body tissues. Follow these instructions at home: Medicines  Take over-the-counter and prescription medicines only as told by your health care provider.  Do not drive or use heavy machinery while taking prescription pain medicine.  If you are taking prescription pain medicine, take actions to prevent or treat constipation. Your health care provider may recommend that you: ? Drink enough fluid to keep your urine pale yellow. ? Eat foods that are high in fiber, such as fresh fruits and vegetables, whole grains, and beans. ? Limit foods that are high in fat and processed sugars, such as fried or sweet foods. ? Take an over-the-counter or prescription medicine for constipation. If you have a brace:  Wear the brace as told by your health care provider. Remove it only as told by your health care provider.  Loosen the brace if your toes tingle, become numb, or turn cold and blue.  Keep the brace clean.  If the brace is not waterproof: ? Do not let it get wet. ? Cover it with a watertight covering when you take a bath or a shower. Managing pain, stiffness, and swelling   If directed, put ice on the swollen area: ? If you have a removable brace, remove it as told by your health care provider. ? Put ice in a plastic bag. ? Place a towel between your skin and the bag. ? Leave the ice on for 20 minutes, 2-3 times per day.  Raise (elevate) your  knee at or above the level of your heart while you are sitting or lying down. General instructions  Do not use any products that contain nicotine or tobacco, such as cigarettes and e-cigarettes. These can delay healing. If you need help quitting, ask your health care provider.  Do not use the injured limb to support your body weight until your health care provider says it is okay. Use crutches as told by your health care provider.  Do any rehabilitation or strengthening exercises as told by your health care provider.  Rest as told by your health care provider. ? Avoid sitting for a long time without moving. Get up to take short walks every 1-2 hours. This is important to improve blood flow and breathing. Ask for help if you feel weak or unsteady.  Keep all follow-up visits as told by your health care provider. This is important. Contact a health care provider if you:  Continue to have pain in your knee. Get help right away if you:  Have swelling or redness of your knee that gets worse or does not get better.  Have severe pain in your knee.  Have a fever. Summary  Knee effusion refers to excess fluid in the knee joint. This causes pain and swelling and makes it difficult to bend and move your knee.  Effusion may be caused by severe arthritis, autoimmune disease, infection, or injury to the knee muscles, ligaments, or cartilage.  Take over-the-counter and  prescription medicines only as told by your health care provider.  If you have a brace, wear the brace as told by your health care provider. This information is not intended to replace advice given to you by your health care provider. Make sure you discuss any questions you have with your health care provider. Document Revised: 04/07/2018 Document Reviewed: 07/31/2017 Elsevier Patient Education  2020 [employer]  

## 2019-09-20 DIAGNOSIS — F331 Major depressive disorder, recurrent, moderate: Secondary | ICD-10-CM | POA: Diagnosis not present

## 2019-10-09 ENCOUNTER — Other Ambulatory Visit: Payer: Self-pay | Admitting: Family Medicine

## 2019-10-09 DIAGNOSIS — F5104 Psychophysiologic insomnia: Secondary | ICD-10-CM

## 2019-10-10 ENCOUNTER — Other Ambulatory Visit: Payer: Self-pay | Admitting: Family Medicine

## 2019-10-15 ENCOUNTER — Other Ambulatory Visit: Payer: Self-pay | Admitting: Family Medicine

## 2019-11-06 ENCOUNTER — Other Ambulatory Visit: Payer: Self-pay | Admitting: Family Medicine

## 2019-11-16 DIAGNOSIS — F4323 Adjustment disorder with mixed anxiety and depressed mood: Secondary | ICD-10-CM | POA: Diagnosis not present

## 2019-12-07 ENCOUNTER — Other Ambulatory Visit: Payer: Self-pay | Admitting: Family Medicine

## 2020-01-08 ENCOUNTER — Other Ambulatory Visit: Payer: Self-pay | Admitting: Family Medicine

## 2020-03-29 ENCOUNTER — Other Ambulatory Visit: Payer: Self-pay | Admitting: Family Medicine

## 2020-06-18 ENCOUNTER — Other Ambulatory Visit: Payer: Self-pay | Admitting: Family Medicine

## 2020-07-06 ENCOUNTER — Other Ambulatory Visit: Payer: Self-pay | Admitting: Family Medicine

## 2020-07-07 IMAGING — CT CT HEART MORP W/ CTA COR W/ SCORE W/ CA W/CM &/OR W/O CM
2 of 8 series · 4 of 20 positions shown, 5 images · IV contrast (omnipaque)
Comparison: None.
COMPARISON: None.

Addendum:
EXAM:
OVER-READ INTERPRETATION  CT CHEST

The following report is an over-read performed by radiologist Dr.
Ahilyn Jovany [REDACTED] on 06/20/2019. This
over-read does not include interpretation of cardiac or coronary
anatomy or pathology. The coronary CTA interpretation by the
cardiologist is attached.
HISTORY: Chest pain, normal ekg CP
Cardiac/Coronary CT
TECHNIQUE: The patient was scanned on a Siemens Force scanner.
PROTOCOL: A 120 kV prospective scan was triggered in the descending thoracic
aorta at 111 HU's. Axial non-contrast 3 mm slices were carried out
through the heart. The data set was analyzed on a dedicated work
station and scored using the Agatson method. Gantry rotation speed
was 250 msecs and collimation was 0.6 mm. Beta blockade and 0.8 mg
of sl NTG was given. The 3D data set was reconstructed in 5%
intervals of 35-75% of the R-R cycle. Diastolic phases were analyzed
on a dedicated work station using MPR, MIP and VRT modes. The
patient received 95mL OMNIPAQUE IOHEXOL 350 MG/ML SOLN of contrast.

[Series 9: ts syst sharp 45 % · axial · 0.39mm/px · z∈[+1097,+1142]mm · 2 of 333 slices shown]
[im 111/333  lung]
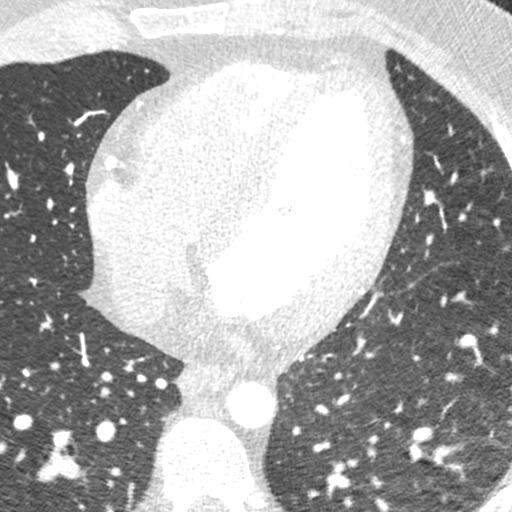
[im 222/333  lung]
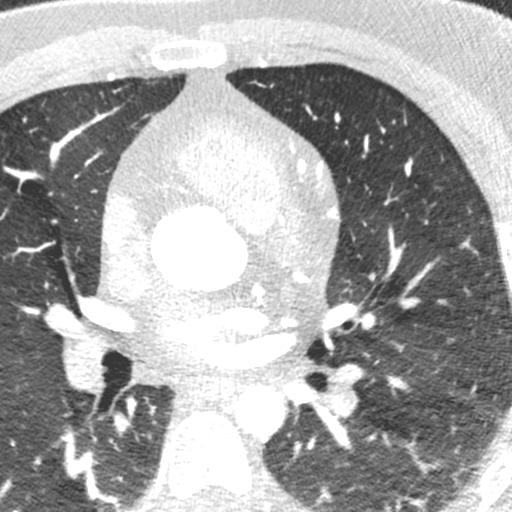

[Series 13: best syst · axial · 0.39mm/px · z∈[+1097,+1142]mm · 2 of 333 slices shown, 3 images]
[im 111/333  vessel]
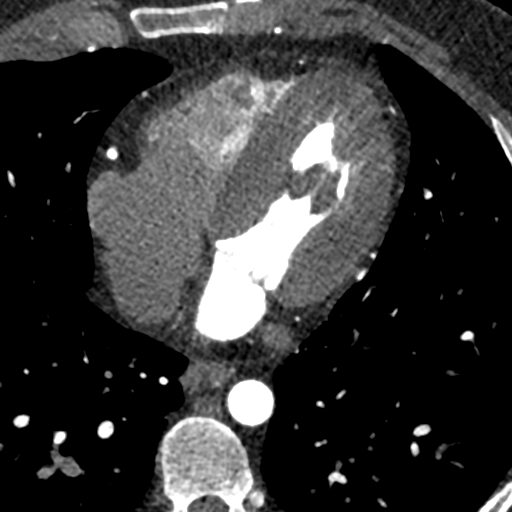
[im 111/333  lung]
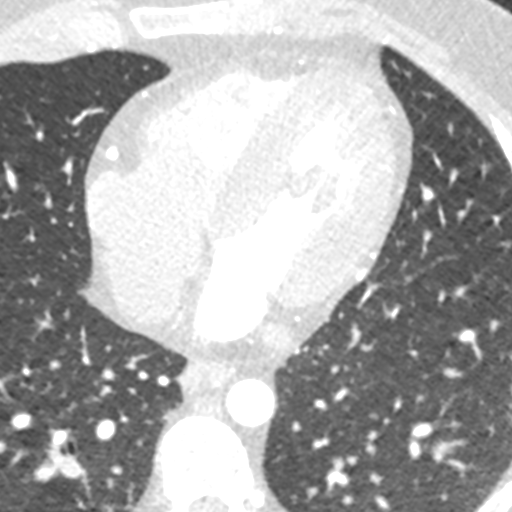
[im 222/333  vessel]
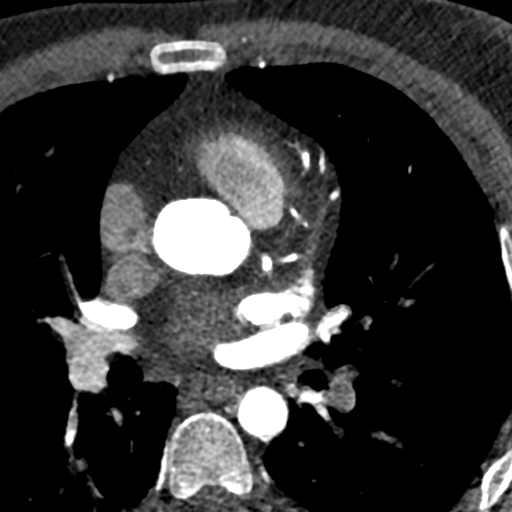

[4 of 20 positions shown; findings below may reference images not displayed]

FINDINGS: Vascular: No incidental findings.

Mediastinum/Nodes: Visualized mediastinum and hilar regions
demonstrate no lymphadenopathy masses.

Lungs/Pleura: Visualized lungs show no evidence of pulmonary edema,
consolidation, pneumothorax, nodule or pleural fluid.

Upper Abdomen: The visualized liver demonstrates significant diffuse
steatosis.

Musculoskeletal: No chest wall mass or suspicious bone lesions
identified.
IMPRESSION: Significant diffuse hepatic steatosis.
FINDINGS: Coronary calcium score: The patient's coronary artery calcium score
is 0, which places the patient in the 0 percentile.

Coronary arteries: Normal coronary origins.  Right dominance.

Right Coronary Artery: Normal caliber vessel, gives rise to large
PDA. No significant plaque or stenosis.

Left Main Coronary Artery: Normal caliber vessel. No significant
plaque or stenosis.

Ramus Intermedius: Normal caliber vessel. No significant plaque or
stenosis.

Left Anterior Descending Coronary Artery: Normal caliber vessel. No
significant plaque or stenosis. Gives rise to 2 large diagonal
branches.

Left Circumflex Artery: Normal caliber vessel. No significant plaque
or stenosis. Gives rise to 1 small and one medium OM branches.

Aorta: Normal size, 28 mm at the mid ascending aorta (level of the
PA bifurcation) measured double oblique. No calcifications. No
dissection.

Aortic Valve: No calcifications. Trileaflet.

Other findings:

Normal pulmonary vein drainage into the left atrium (normal variant
of 3 PV ostia from R PV drainage).

Normal left atrial appendage without a thrombus.

Normal size of the pulmonary artery.
IMPRESSION: 1. No evidence of CAD, CADRADS = 0.

2. Coronary calcium score of 0. This was 0 percentile for age and
sex matched control.

3. Normal coronary origin with right dominance.

*** End of Addendum ***
EXAM:
OVER-READ INTERPRETATION  CT CHEST

The following report is an over-read performed by radiologist Dr.
Ahilyn Jovany [REDACTED] on 06/20/2019. This
over-read does not include interpretation of cardiac or coronary
anatomy or pathology. The coronary CTA interpretation by the
cardiologist is attached.
FINDINGS: Vascular: No incidental findings.

Mediastinum/Nodes: Visualized mediastinum and hilar regions
demonstrate no lymphadenopathy masses.

Lungs/Pleura: Visualized lungs show no evidence of pulmonary edema,
consolidation, pneumothorax, nodule or pleural fluid.

Upper Abdomen: The visualized liver demonstrates significant diffuse
steatosis.

Musculoskeletal: No chest wall mass or suspicious bone lesions
identified.
IMPRESSION: Significant diffuse hepatic steatosis.

## 2020-09-29 ENCOUNTER — Encounter: Payer: Self-pay | Admitting: Family Medicine

## 2020-10-02 ENCOUNTER — Other Ambulatory Visit: Payer: Self-pay

## 2020-10-03 ENCOUNTER — Encounter: Payer: Self-pay | Admitting: Family Medicine

## 2020-10-03 ENCOUNTER — Ambulatory Visit (INDEPENDENT_AMBULATORY_CARE_PROVIDER_SITE_OTHER): Payer: BC Managed Care – PPO | Admitting: Family Medicine

## 2020-10-03 VITALS — BP 128/88 | HR 86 | Ht 70.0 in | Wt 231.0 lb

## 2020-10-03 DIAGNOSIS — R202 Paresthesia of skin: Secondary | ICD-10-CM

## 2020-10-03 DIAGNOSIS — R5383 Other fatigue: Secondary | ICD-10-CM | POA: Diagnosis not present

## 2020-10-03 DIAGNOSIS — R400 Somnolence: Secondary | ICD-10-CM | POA: Diagnosis not present

## 2020-10-03 LAB — CBC WITH DIFFERENTIAL/PLATELET
Basophils Absolute: 0.1 10*3/uL (ref 0.0–0.1)
Basophils Relative: 1 % (ref 0.0–3.0)
Eosinophils Absolute: 0.3 10*3/uL (ref 0.0–0.7)
Eosinophils Relative: 4.8 % (ref 0.0–5.0)
HCT: 42.9 % (ref 39.0–52.0)
Hemoglobin: 15.1 g/dL (ref 13.0–17.0)
Lymphocytes Relative: 42.9 % (ref 12.0–46.0)
Lymphs Abs: 2.4 10*3/uL (ref 0.7–4.0)
MCHC: 35.1 g/dL (ref 30.0–36.0)
MCV: 91.5 fl (ref 78.0–100.0)
Monocytes Absolute: 0.5 10*3/uL (ref 0.1–1.0)
Monocytes Relative: 9.8 % (ref 3.0–12.0)
Neutro Abs: 2.3 10*3/uL (ref 1.4–7.7)
Neutrophils Relative %: 41.5 % — ABNORMAL LOW (ref 43.0–77.0)
Platelets: 252 10*3/uL (ref 150.0–400.0)
RBC: 4.69 Mil/uL (ref 4.22–5.81)
RDW: 12.9 % (ref 11.5–15.5)
WBC: 5.5 10*3/uL (ref 4.0–10.5)

## 2020-10-03 LAB — COMPREHENSIVE METABOLIC PANEL
ALT: 71 U/L — ABNORMAL HIGH (ref 0–53)
AST: 40 U/L — ABNORMAL HIGH (ref 0–37)
Albumin: 4.6 g/dL (ref 3.5–5.2)
Alkaline Phosphatase: 68 U/L (ref 39–117)
BUN: 16 mg/dL (ref 6–23)
CO2: 25 mEq/L (ref 19–32)
Calcium: 9.5 mg/dL (ref 8.4–10.5)
Chloride: 106 mEq/L (ref 96–112)
Creatinine, Ser: 1.04 mg/dL (ref 0.40–1.50)
GFR: 92.62 mL/min (ref >60.00)
Glucose, Bld: 93 mg/dL (ref 70–99)
Potassium: 4.2 mEq/L (ref 3.5–5.1)
Sodium: 139 mEq/L (ref 135–145)
Total Bilirubin: 0.7 mg/dL (ref 0.2–1.2)
Total Protein: 7.1 g/dL (ref 6.0–8.3)

## 2020-10-03 LAB — TSH: TSH: 0.59 u[IU]/mL (ref 0.35–4.50)

## 2020-10-03 LAB — VITAMIN B12: Vitamin B-12: 199 pg/mL — ABNORMAL LOW (ref 211–911)

## 2020-10-03 NOTE — Patient Instructions (Signed)
We will call by early next week with results  Would consider home sleep study if labs unrevealing.

## 2020-10-03 NOTE — Progress Notes (Signed)
Established Patient Office Visit  Subjective:  Patient ID: Timothy Foster, male    DOB: 04-Jun-1986  Age: 35 y.o. MRN: [id number]  CC:  Chief Complaint  Patient presents with  . Fatigue    HPI Timothy Foster presents for complaints of some daytime somnolence and increased fatigue.  He states he is sleeping about 9 to 10 hours at night fairly uneventfully and uninterrupted but wakes up not always feeling rested.  He states he frequently will take a 2-hour nap during the day as well and still feels fatigue.  He does snore.  His partner has not mentioned any observed apnea episodes.  He has had some concerns regarding whether he may have sleep apnea.  No restless leg symptoms.  He denies feeling depressed.  He did score 15 on PHQ-9 but this was largely related to symptoms of fatigue and increased somnolence.  He does take Prilosec 40 mg daily and has had some intermittent paresthesias upper extremities.  He is concerned about possible B12 deficiency.  He had recent labs through work but this included only lipids and A1c.  His A1c was 5.4.  He walks his dog about 2 miles per day.  No exercise intolerance.  No chest pains.  No dyspnea.  No major appetite or weight changes.  Does have strong family history of diabetes in both parents.  His father had history of stroke and esophagus cancer.  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  He usually works about 9 days/month.  He has scaled back to part-time.  Past Medical History:  Diagnosis Date  . Allergy   . Anxiety   . Chicken pox   . Depression   . GERD (gastroesophageal reflux disease)   . Headache(784.0)   . Hyperlipidemia     Past Surgical History:  Procedure Laterality Date  . WISDOM TOOTH EXTRACTION      Family History  Problem Relation Age of Onset  . Diabetes Mother   . Stroke Father   . Diabetes Father   . Esophageal cancer Father   . Alcohol abuse Maternal Uncle   . Alcohol abuse Paternal Uncle   . Cancer Paternal  Uncle   . Pancreatic cancer Paternal Uncle   . Colon cancer Paternal Uncle   . Crohn's disease Maternal Aunt   . Stomach cancer Paternal Grandfather     Social History   Socioeconomic History  . Marital status: Married    Spouse name: Not on file  . Number of children: Not on file  . Years of education: Not on file  . Highest education level: Not on file  Occupational History  . Occupation: Product manager: [employer]  Tobacco Use  . Smoking status: Never Smoker  . Smokeless tobacco: Never Used  Vaping Use  . Vaping Use: Never used  Substance and Sexual Activity  . Alcohol use: Yes    Comment: couple times per month  . Drug use: No  . Sexual activity: Not on file  Other Topics Concern  . Not on file  Social History Narrative  . Not on file   Social Determinants of Health   Financial Resource Strain: Not on file  Food Insecurity: Not on file  Transportation Needs: Not on file  Physical Activity: Not on file  Stress: Not on file  Social Connections: Not on file  Intimate Partner Violence: Not on file    Outpatient Medications Prior to Visit  Medication Sig Dispense Refill  .  fenofibrate 160 MG tablet TAKE 1 TABLET DAILY (PLEASE SCHEDULE AN APPOINTMENT WITH LABS FOR REFILLS [phone]) 90 tablet 0  . fexofenadine (ALLEGRA) 180 MG tablet Take 1 tablet (180 mg total) by mouth daily. 30 tablet 1  . fluticasone (FLONASE) 50 MCG/ACT nasal spray Place into both nostrils daily as needed for allergies or rhinitis.    Marland Kitchen losartan (COZAAR) 50 MG tablet TAKE 1 TABLET BY MOUTH EVERY DAY 90 tablet 1  . omega-3 acid ethyl esters (LOVAZA) 1 g capsule Take 1 capsule (1 g total) by mouth 2 (two) times daily. 60 capsule 11  . omeprazole (PRILOSEC) 40 MG capsule TAKE 1 CAPSULE DAILY 90 capsule 3  . SUMAtriptan (IMITREX) 100 MG tablet Take 1 tablet at onset of migraine and may repeat 1 in 2 hours as needed for persistent headache but no more than 2 in 24  hours 10 tablet 2  . venlafaxine XR (EFFEXOR-XR) 150 MG 24 hr capsule TAKE 1 CAPSULE DAILY 90 capsule 3  . meloxicam (MOBIC) 15 MG tablet TAKE 1 TABLET BY MOUTH EVERY DAY 30 tablet 1  . traZODone (DESYREL) 50 MG tablet TAKE 1 TABLET AT BEDTIME AS NEEDED FOR SLEEP 90 tablet 0   No facility-administered medications prior to visit.    Allergies  Allergen Reactions  . Penicillins Anaphylaxis    Hives as a child Hives as a child    ROS Review of Systems  Constitutional: Positive for fatigue. Negative for appetite change and unexpected weight change.  Eyes: Negative for visual disturbance.  Respiratory: Negative for cough, chest tightness and shortness of breath.   Cardiovascular: Negative for chest pain, palpitations and leg swelling.  Endocrine: Negative for polydipsia and polyuria.  Genitourinary: Negative for dysuria.  Neurological: Negative for dizziness, syncope, weakness, light-headedness and headaches.      Objective:    Physical Exam Constitutional:      Appearance: He is well-developed and well-nourished.  HENT:     Right Ear: External ear normal.     Left Ear: External ear normal.     Mouth/Throat:     Mouth: Oropharynx is clear and moist.  Eyes:     Pupils: Pupils are equal, round, and reactive to light.  Neck:     Thyroid: No thyromegaly.  Cardiovascular:     Rate and Rhythm: Normal rate and regular rhythm.  Pulmonary:     Effort: Pulmonary effort is normal. No respiratory distress.     Breath sounds: Normal breath sounds. No wheezing or rales.  Musculoskeletal:        General: No edema.     Cervical back: Neck supple.     Right lower leg: No edema.     Left lower leg: No edema.  Neurological:     Mental Status: He is alert and oriented to person, place, and time.  Psychiatric:        Mood and Affect: Mood normal.        Thought Content: Thought content normal.     BP 128/88   Pulse 86   Ht 5\' 10"  (1.778 m)   Wt 231 lb (104.8 kg)   SpO2 96%   BMI  33.15 kg/m  Wt Readings from Last 3 Encounters:  10/03/20 231 lb (104.8 kg)  08/29/19 225 lb 9.6 oz (102.3 kg)  07/31/19 224 lb (101.6 kg)     Health Maintenance Due  Topic Date Due  . Hepatitis C Screening  Never done  . HIV Screening  Never done  There are no preventive care reminders to display for this patient.  Lab Results  Component Value Date   TSH 0.92 07/25/2017   Lab Results  Component Value Date   WBC 7.2 07/25/2017   HGB 14.2 07/25/2017   HCT 41.0 07/25/2017   MCV 92.7 07/25/2017   PLT 206.0 07/25/2017   Lab Results  Component Value Date   NA 142 06/01/2019   K 4.5 06/01/2019   CO2 21 06/01/2019   GLUCOSE 105 (H) 06/01/2019   BUN 10 06/01/2019   CREATININE 1.11 06/01/2019   BILITOT 0.8 09/18/2018   ALKPHOS 62 09/18/2018   AST 25 09/18/2018   ALT 49 09/18/2018   PROT 7.4 09/18/2018   ALBUMIN 5.0 09/18/2018   CALCIUM 10.0 06/01/2019   GFR 79.46 07/25/2017   Lab Results  Component Value Date   CHOL 192 09/18/2018   Lab Results  Component Value Date   HDL 28.10 (L) 09/18/2018   No results found for: [facility] Lab Results  Component Value Date   TRIG 280.0 (H) 09/18/2018   Lab Results  Component Value Date   CHOLHDL 7 09/18/2018   Lab Results  Component Value Date   HGBA1C 5.3 05/07/2019      Assessment & Plan:   Problem List Items Addressed This Visit   None   Visit Diagnoses    Fatigue, unspecified type    -  Primary   Relevant Orders   CBC with Differential/Platelet   TSH   CMP   Vitamin B12   Daytime somnolence        He did score 15 on PHQ-9 but clinically does not seem depressed.  He had high scores for fatigue and daytime somnolence which increased his score.  He has had depression in the past but does not feel this is an active issue.  Needs B12 level with chronic PPI use and some recent paresthesias in view of his fatigue.  Also check TSH, CBC, comprehensive metabolic panel.  If all the above normal consider home  sleep study to rule out obstructive sleep apnea.  No orders of the defined types were placed in this encounter.   Follow-up: No follow-ups on file.    Carolann Littler, MD

## 2020-10-05 ENCOUNTER — Other Ambulatory Visit: Payer: Self-pay | Admitting: Family Medicine

## 2020-10-06 ENCOUNTER — Other Ambulatory Visit: Payer: Self-pay | Admitting: Family Medicine

## 2020-10-10 ENCOUNTER — Encounter: Payer: Self-pay | Admitting: Family Medicine

## 2020-10-10 MED ORDER — TELMISARTAN 40 MG PO TABS: 40.0000 mg | ORAL_TABLET | Freq: Every day | ORAL | 3 refills | Status: DC

## 2020-12-01 ENCOUNTER — Other Ambulatory Visit: Payer: Self-pay | Admitting: Family Medicine

## 2021-01-05 ENCOUNTER — Other Ambulatory Visit: Payer: Self-pay | Admitting: Family Medicine

## 2021-01-06 ENCOUNTER — Other Ambulatory Visit: Payer: Self-pay | Admitting: Family Medicine

## 2021-01-30 ENCOUNTER — Ambulatory Visit (INDEPENDENT_AMBULATORY_CARE_PROVIDER_SITE_OTHER): Payer: BC Managed Care – PPO | Admitting: Family Medicine

## 2021-01-30 ENCOUNTER — Encounter: Payer: Self-pay | Admitting: Family Medicine

## 2021-01-30 ENCOUNTER — Other Ambulatory Visit: Payer: Self-pay

## 2021-01-30 VITALS — BP 140/84 | HR 103 | Temp 98.3°F | Wt 229.6 lb

## 2021-01-30 DIAGNOSIS — M79631 Pain in right forearm: Secondary | ICD-10-CM

## 2021-01-30 DIAGNOSIS — E538 Deficiency of other specified B group vitamins: Secondary | ICD-10-CM

## 2021-01-30 DIAGNOSIS — R7401 Elevation of levels of liver transaminase levels: Secondary | ICD-10-CM

## 2021-01-30 DIAGNOSIS — R6882 Decreased libido: Secondary | ICD-10-CM

## 2021-01-30 NOTE — Progress Notes (Signed)
Established Patient Office Visit  Subjective:  Patient ID: Timothy Foster, male    DOB: 25-Dec-1985  Age: 35 y.o. MRN: [id number]  CC:  Chief Complaint  Patient presents with   Labs Only    Pt states he has no sex drive and even with the B12 he is still very fatigued. Would like testosterone checked. Pt also stated that his arms go numb, both arms but right arm is worse    HPI Timothy Foster presents for several items  Low libido.  Has been going on for some time.  He is concerned this may be related to low testosterone.  Has not had levels checked previously to his knowledge.  He does take Effexor and he realizes serotonin medications can have some effect as well.  Low B12.  This was detected recently with level of 199.  Risk factor is omeprazole use.  He is taking oral replacement B12 daily.  Needs follow-up level soon.  Intermittent paresthesias and pain right upper extremity.  Mostly around the wrist but extend up above the forearm to the arm at times.  No cervical pain.  History of mildly elevated liver transaminases.  No regular alcohol use.  History of fatty liver by previous ultrasound 2013  Past Medical History:  Diagnosis Date   Allergy    Anxiety    Chicken pox    Depression    GERD (gastroesophageal reflux disease)    Headache(784.0)    Hyperlipidemia     Past Surgical History:  Procedure Laterality Date   WISDOM TOOTH EXTRACTION      Family History  Problem Relation Age of Onset   Diabetes Mother    Stroke Father    Diabetes Father    Esophageal cancer Father    Alcohol abuse Maternal Uncle    Alcohol abuse Paternal Uncle    Cancer Paternal Uncle    Pancreatic cancer Paternal Uncle    Colon cancer Paternal Uncle    Crohn's disease Maternal Aunt    Stomach cancer Paternal Grandfather     Social History   Socioeconomic History   Marital status: Married    Spouse name: Not on file   Number of children: Not on file   Years of education: Not on file    Highest education level: Not on file  Occupational History   Occupation: Product manager: [employer]  Tobacco Use   Smoking status: Never   Smokeless tobacco: Never  Vaping Use   Vaping Use: Never used  Substance and Sexual Activity   Alcohol use: Yes    Comment: couple times per month   Drug use: No   Sexual activity: Not on file  Other Topics Concern   Not on file  Social History Narrative   Not on file   Social Determinants of Health   Financial Resource Strain: Not on file  Food Insecurity: Not on file  Transportation Needs: Not on file  Physical Activity: Not on file  Stress: Not on file  Social Connections: Not on file  Intimate Partner Violence: Not on file    Outpatient Medications Prior to Visit  Medication Sig Dispense Refill   fenofibrate 160 MG tablet TAKE 1 TABLET DAILY (PLEASE SCHEDULE AN APPOINTMENT WITH LABS FOR REFILLS [phone]) 90 tablet 3   fexofenadine (ALLEGRA) 180 MG tablet Take 1 tablet (180 mg total) by mouth daily. 30 tablet 1   fluticasone (FLONASE) 50 MCG/ACT nasal spray Place into both nostrils daily as  needed for allergies or rhinitis.     omeprazole (PRILOSEC) 40 MG capsule TAKE 1 CAPSULE DAILY 90 capsule 3   SUMAtriptan (IMITREX) 100 MG tablet Take 1 tablet at onset of migraine and may repeat 1 in 2 hours as needed for persistent headache but no more than 2 in 24 hours 10 tablet 2   telmisartan (MICARDIS) 40 MG tablet TAKE 1 TABLET BY MOUTH EVERY DAY 90 tablet 1   venlafaxine XR (EFFEXOR-XR) 150 MG 24 hr capsule TAKE 1 CAPSULE DAILY 90 capsule 0   omega-3 acid ethyl esters (LOVAZA) 1 g capsule Take 1 capsule (1 g total) by mouth 2 (two) times daily. 60 capsule 11   No facility-administered medications prior to visit.    Allergies  Allergen Reactions   Penicillins Anaphylaxis    Hives as a child Hives as a child    ROS Review of Systems  Constitutional:  Positive for fatigue. Negative for appetite  change, chills, fever and unexpected weight change.  Respiratory:  Negative for shortness of breath.   Cardiovascular:  Negative for chest pain.  Genitourinary:  Negative for dysuria.  Hematological:  Negative for adenopathy. Does not bruise/bleed easily.     Objective:    Physical Exam Vitals reviewed.  Cardiovascular:     Rate and Rhythm: Normal rate and regular rhythm.  Pulmonary:     Effort: Pulmonary effort is normal.     Breath sounds: Normal breath sounds.  Musculoskeletal:     Comments: Positive Tinel's sign  Skin:    Findings: No rash.  Neurological:     Mental Status: He is alert.     Comments: Full strength upper extremities.  No muscle atrophy.  Symmetric upper extremity reflexes.    BP 140/84 (BP Location: Left Arm, Patient Position: Sitting, Cuff Size: Normal)   Pulse (!) 103   Temp 98.3 F (36.8 C) (Oral)   Wt 229 lb 9.6 oz (104.1 kg)   SpO2 97%   BMI 32.94 kg/m  Wt Readings from Last 3 Encounters:  01/30/21 229 lb 9.6 oz (104.1 kg)  10/03/20 231 lb (104.8 kg)  08/29/19 225 lb 9.6 oz (102.3 kg)     Health Maintenance Due  Topic Date Due   HIV Screening  Never done   Hepatitis C Screening  Never done    There are no preventive care reminders to display for this patient.  Lab Results  Component Value Date   TSH 0.59 10/03/2020   Lab Results  Component Value Date   WBC 5.5 10/03/2020   HGB 15.1 10/03/2020   HCT 42.9 10/03/2020   MCV 91.5 10/03/2020   PLT 252.0 10/03/2020   Lab Results  Component Value Date   NA 139 10/03/2020   K 4.2 10/03/2020   CO2 25 10/03/2020   GLUCOSE 93 10/03/2020   BUN 16 10/03/2020   CREATININE 1.04 10/03/2020   BILITOT 0.7 10/03/2020   ALKPHOS 68 10/03/2020   AST 40 (H) 10/03/2020   ALT 71 (H) 10/03/2020   PROT 7.1 10/03/2020   ALBUMIN 4.6 10/03/2020   CALCIUM 9.5 10/03/2020   GFR 92.62 10/03/2020   Lab Results  Component Value Date   CHOL 192 09/18/2018   Lab Results  Component Value Date   HDL  28.10 (L) 09/18/2018   No results found for: [facility] Lab Results  Component Value Date   TRIG 280.0 (H) 09/18/2018   Lab Results  Component Value Date   CHOLHDL 7 09/18/2018   Lab Results  Component Value Date   HGBA1C 5.3 05/07/2019      Assessment & Plan:   #1 low libido.  Etiology unclear.  Does take serotonin medication with Effexor Rule out low testosterone  -Schedule labs for early morning testosterone level   #2 low B12 by recent labs.  Patient on oral replacement.  Risk factor of chronic PPI use  -Recheck B12 level.  If still low consider intramuscular replacement  #3 history of elevated liver transaminase enzymes.  Known fatty liver changes. -Keep weight down -Avoid excessive use of alcohol -Recheck liver panel with upcoming labs  #4 intermittent paresthesias and pain right upper extremity.  Suspect possible carpal tunnel syndrome.  -Handout given.  Recommend extension wrist splint and be in touch if symptoms not improving over the next few weeks   No orders of the defined types were placed in this encounter.   Follow-up: No follow-ups on file.    Carolann Littler, MD

## 2021-02-03 ENCOUNTER — Other Ambulatory Visit (INDEPENDENT_AMBULATORY_CARE_PROVIDER_SITE_OTHER): Payer: BC Managed Care – PPO

## 2021-02-03 DIAGNOSIS — E538 Deficiency of other specified B group vitamins: Secondary | ICD-10-CM | POA: Diagnosis not present

## 2021-02-03 DIAGNOSIS — R7401 Elevation of levels of liver transaminase levels: Secondary | ICD-10-CM

## 2021-02-03 DIAGNOSIS — R6882 Decreased libido: Secondary | ICD-10-CM | POA: Diagnosis not present

## 2021-02-03 LAB — HEPATIC FUNCTION PANEL
ALT: 68 U/L — ABNORMAL HIGH (ref 0–53)
AST: 31 U/L (ref 0–37)
Albumin: 5 g/dL (ref 3.5–5.2)
Alkaline Phosphatase: 89 U/L (ref 39–117)
Bilirubin, Direct: 0.1 mg/dL (ref 0.0–0.3)
Total Bilirubin: 0.5 mg/dL (ref 0.2–1.2)
Total Protein: 7.4 g/dL (ref 6.0–8.3)

## 2021-02-03 LAB — VITAMIN B12: Vitamin B-12: 1458 pg/mL — ABNORMAL HIGH (ref 211–911)

## 2021-02-03 LAB — TESTOSTERONE: Testosterone: 327.51 ng/dL (ref 300.00–890.00)

## 2021-03-02 ENCOUNTER — Other Ambulatory Visit: Payer: Self-pay | Admitting: Family Medicine

## 2021-03-04 ENCOUNTER — Telehealth (INDEPENDENT_AMBULATORY_CARE_PROVIDER_SITE_OTHER): Payer: BC Managed Care – PPO | Admitting: Family Medicine

## 2021-03-04 ENCOUNTER — Other Ambulatory Visit: Payer: Self-pay

## 2021-03-04 ENCOUNTER — Encounter: Payer: Self-pay | Admitting: Family Medicine

## 2021-03-04 DIAGNOSIS — U071 COVID-19: Secondary | ICD-10-CM

## 2021-03-04 MED ORDER — HYDROCODONE BIT-HOMATROP MBR 5-1.5 MG/5ML PO SOLN: 5.0000 mL | Freq: Three times a day (TID) | ORAL | 0 refills | Status: DC | PRN

## 2021-03-04 NOTE — Progress Notes (Signed)
Patient ID: Timothy Foster, male   DOB: [birth year], 35 y.o.   MRN: [id number]   This visit type was conducted due to national recommendations for restrictions regarding the COVID-19 pandemic in an effort to limit this patient's exposure and mitigate transmission in our community.   Virtual Visit via Video Note  I connected with Timothy Foster on 03/04/21 at  5:30 PM EDT by a video enabled telemedicine application and verified that I am speaking with the correct person using two identifiers.  Location patient: home Location provider:work or home office Persons participating in the virtual visit: patient, provider  I discussed the limitations of evaluation and management by telemedicine and the availability of in person appointments. The patient expressed understanding and agreed to proceed.   HPI: Timothy Foster has COVID-19 infection.  He developed symptoms yesterday of sore throat followed by body aches, cough, nasal congestion, fatigue.  He did initial COVID test last night which came back negative but then today this came back very strongly positive.  He thinks he may have gotten sick through work exposure.  Lowest pulse oximeter 93% currently 97%.  He has had some decreased appetite and mild nausea but no vomiting.  No diarrhea.  Does have hypertension which has been controlled with medication.   ROS: See pertinent positives and negatives per HPI.  Past Medical History:  Diagnosis Date   Allergy    Anxiety    Chicken pox    Depression    GERD (gastroesophageal reflux disease)    Headache(784.0)    Hyperlipidemia     Past Surgical History:  Procedure Laterality Date   WISDOM TOOTH EXTRACTION      Family History  Problem Relation Age of Onset   Diabetes Mother    Stroke Father    Diabetes Father    Esophageal cancer Father    Alcohol abuse Maternal Uncle    Alcohol abuse Paternal Uncle    Cancer Paternal Uncle    Pancreatic cancer Paternal Uncle    Colon cancer Paternal Uncle     Crohn's disease Maternal Aunt    Stomach cancer Paternal Grandfather     SOCIAL HX: Non-smoker   Current Outpatient Medications:    fenofibrate 160 MG tablet, TAKE 1 TABLET DAILY (PLEASE SCHEDULE AN APPOINTMENT WITH LABS FOR REFILLS [phone]), Disp: 90 tablet, Rfl: 3   fexofenadine (ALLEGRA) 180 MG tablet, Take 1 tablet (180 mg total) by mouth daily., Disp: 30 tablet, Rfl: 1   fluticasone (FLONASE) 50 MCG/ACT nasal spray, Place into both nostrils daily as needed for allergies or rhinitis., Disp: , Rfl:    HYDROcodone bit-homatropine (HYCODAN) 5-1.5 MG/5ML syrup, Take 5 mLs by mouth every 8 (eight) hours as needed for cough., Disp: 120 mL, Rfl: 0   omeprazole (PRILOSEC) 40 MG capsule, TAKE 1 CAPSULE DAILY, Disp: 90 capsule, Rfl: 3   SUMAtriptan (IMITREX) 100 MG tablet, Take 1 tablet at onset of migraine and may repeat 1 in 2 hours as needed for persistent headache but no more than 2 in 24 hours, Disp: 10 tablet, Rfl: 2   telmisartan (MICARDIS) 40 MG tablet, TAKE 1 TABLET BY MOUTH EVERY DAY, Disp: 90 tablet, Rfl: 1   venlafaxine XR (EFFEXOR-XR) 150 MG 24 hr capsule, TAKE 1 CAPSULE DAILY (NEEDS A PHYSICAL FOR FURTHER REFILLS), Disp: 90 capsule, Rfl: 3  EXAM:  VITALS per patient if applicable:  GENERAL: alert, oriented, appears well and in no acute distress  HEENT: atraumatic, conjunttiva clear, no obvious abnormalities on inspection of external nose  and ears  NECK: normal movements of the head and neck  LUNGS: on inspection no signs of respiratory distress, breathing rate appears normal, no obvious gross SOB, gasping or wheezing  CV: no obvious cyanosis  MS: moves all visible extremities without noticeable abnormality  PSYCH/NEURO: pleasant and cooperative, no obvious depression or anxiety, speech and thought processing grossly intact  ASSESSMENT AND PLAN:  Discussed the following assessment and plan:   COVID-19 infection.  Patient in no respiratory distress.  Cough has been  bothersome at night and interfering with sleep.  He tried over-the-counter without much relief.  -Plenty of fluids and rest -He is aware of isolation recommendations per CDC -Hycodan cough syrup 1 teaspoon nightly as needed for severe cough.  He tried Tessalon previously and did not see much benefit -We did discuss antiviral therapy but he declines at this time.  Follow-up for any persistent or worsening symptoms.    I discussed the assessment and treatment plan with the patient. The patient was provided an opportunity to ask questions and all were answered. The patient agreed with the plan and demonstrated an understanding of the instructions.   The patient was advised to call back or seek an in-person evaluation if the symptoms worsen or if the condition fails to improve as anticipated.     Carolann Littler, MD

## 2021-03-09 ENCOUNTER — Encounter: Payer: Self-pay | Admitting: Family Medicine

## 2021-04-27 ENCOUNTER — Encounter: Payer: Self-pay | Admitting: Family Medicine

## 2021-04-27 MED ORDER — SUMATRIPTAN SUCCINATE 100 MG PO TABS: ORAL_TABLET | ORAL | 2 refills | Status: DC

## 2021-05-20 ENCOUNTER — Encounter: Payer: Self-pay | Admitting: Family Medicine

## 2021-06-09 ENCOUNTER — Other Ambulatory Visit: Payer: Self-pay

## 2021-06-09 ENCOUNTER — Ambulatory Visit (INDEPENDENT_AMBULATORY_CARE_PROVIDER_SITE_OTHER): Payer: BC Managed Care – PPO | Admitting: Family Medicine

## 2021-06-09 VITALS — BP 120/80 | HR 90 | Temp 98.0°F | Wt 225.7 lb

## 2021-06-09 DIAGNOSIS — G43109 Migraine with aura, not intractable, without status migrainosus: Secondary | ICD-10-CM

## 2021-06-09 MED ORDER — TOPIRAMATE 25 MG PO TABS: ORAL_TABLET | ORAL | 5 refills | Status: DC

## 2021-06-09 MED ORDER — SUMATRIPTAN SUCCINATE 100 MG PO TABS: ORAL_TABLET | ORAL | 5 refills | Status: DC

## 2021-06-09 NOTE — Progress Notes (Signed)
Established Patient Office Visit  Subjective:  Patient ID: Timothy Foster, male    DOB: 07/22/1986  Age: 35 y.o. MRN: [id number]  CC:  Chief Complaint  Patient presents with   Migraine    Discuss prevention, avg 2 migraines per month lasting 3-4 days at a time    HPI Timothy Foster presents to discuss migraine headaches.  He has had these for several years.  Usually gets about 2/month but sometimes can last 3 to 4 days.  Had one recently on vacation which lasted for a few days.  At baseline he takes Effexor XR 150 mg once daily.  He has Imitrex for acute use and this does generally work well if he is able to catch these early.  With his recent vacation he did not have any available immediately.  He does have occasional visual auras.  His headaches are usually frontal and unilateral and throbbing quality with some associated nausea and photophobia.  Denies any headache at this time.  He is not aware of any specific food triggers other than possibly red wine.  He does think stress is a trigger as well.  Past Medical History:  Diagnosis Date   Allergy    Anxiety    Chicken pox    Depression    GERD (gastroesophageal reflux disease)    Headache(784.0)    Hyperlipidemia     Past Surgical History:  Procedure Laterality Date   WISDOM TOOTH EXTRACTION      Family History  Problem Relation Age of Onset   Diabetes Mother    Stroke Father    Diabetes Father    Esophageal cancer Father    Alcohol abuse Maternal Uncle    Alcohol abuse Paternal Uncle    Cancer Paternal Uncle    Pancreatic cancer Paternal Uncle    Colon cancer Paternal Uncle    Crohn's disease Maternal Aunt    Stomach cancer Paternal Grandfather     Social History   Socioeconomic History   Marital status: Married    Spouse name: Not on file   Number of children: Not on file   Years of education: Not on file   Highest education level: Associate degree: occupational, Hotel manager, or vocational program  Occupational  History   Occupation: Product manager: [employer]  Tobacco Use   Smoking status: Never   Smokeless tobacco: Never  Vaping Use   Vaping Use: Never used  Substance and Sexual Activity   Alcohol use: Yes    Comment: couple times per month   Drug use: No   Sexual activity: Not on file  Other Topics Concern   Not on file  Social History Narrative   Not on file   Social Determinants of Health   Financial Resource Strain: Low Risk    Difficulty of Paying Living Expenses: Not very hard  Food Insecurity: No Food Insecurity   Worried About Charity fundraiser in the Last Year: Never true   Ran Out of Food in the Last Year: Never true  Transportation Needs: No Transportation Needs   Lack of Transportation (Medical): No   Lack of Transportation (Non-Medical): No  Physical Activity: Insufficiently Active   Days of Exercise per Week: 3 days   Minutes of Exercise per Session: 20 min  Stress: No Stress Concern Present   Feeling of Stress : Only a little  Social Connections: Unknown   Frequency of Communication with Friends and Family: Patient refused   Frequency  of Social Gatherings with Friends and Family: Twice a week   Attends Religious Services: Never   Marine scientist or Organizations: No   Attends Music therapist: Not on file   Marital Status: Married  Human resources officer Violence: Not on file    Outpatient Medications Prior to Visit  Medication Sig Dispense Refill   fenofibrate 160 MG tablet TAKE 1 TABLET DAILY (PLEASE SCHEDULE AN APPOINTMENT WITH LABS FOR REFILLS [phone]) 90 tablet 3   fexofenadine (ALLEGRA) 180 MG tablet Take 1 tablet (180 mg total) by mouth daily. 30 tablet 1   fluticasone (FLONASE) 50 MCG/ACT nasal spray Place into both nostrils daily as needed for allergies or rhinitis.     omeprazole (PRILOSEC) 40 MG capsule TAKE 1 CAPSULE DAILY 90 capsule 3   telmisartan (MICARDIS) 40 MG tablet TAKE 1 TABLET BY MOUTH  EVERY DAY 90 tablet 1   venlafaxine XR (EFFEXOR-XR) 150 MG 24 hr capsule TAKE 1 CAPSULE DAILY (NEEDS A PHYSICAL FOR FURTHER REFILLS) 90 capsule 3   SUMAtriptan (IMITREX) 100 MG tablet Take 1 tablet at onset of migraine and may repeat 1 in 2 hours as needed for persistent headache but no more than 2 in 24 hours 10 tablet 2   HYDROcodone bit-homatropine (HYCODAN) 5-1.5 MG/5ML syrup Take 5 mLs by mouth every 8 (eight) hours as needed for cough. 120 mL 0   No facility-administered medications prior to visit.    Allergies  Allergen Reactions   Penicillins Anaphylaxis    Hives as a child Hives as a child    ROS Review of Systems  Constitutional:  Negative for chills and fever.  Cardiovascular:  Negative for chest pain.  Neurological:  Positive for headaches. Negative for dizziness.     Objective:    Physical Exam Vitals reviewed.  Constitutional:      Appearance: Normal appearance.  Eyes:     Extraocular Movements: Extraocular movements intact.     Pupils: Pupils are equal, round, and reactive to light.  Cardiovascular:     Rate and Rhythm: Normal rate and regular rhythm.     Heart sounds: No murmur heard. Pulmonary:     Effort: Pulmonary effort is normal.     Breath sounds: Normal breath sounds.  Neurological:     General: No focal deficit present.     Mental Status: He is alert.    BP 120/80 (BP Location: Left Arm, Patient Position: Sitting, Cuff Size: Normal)   Pulse 90   Temp 98 F (36.7 C) (Oral)   Wt 225 lb 11.2 oz (102.4 kg)   SpO2 98%   BMI 32.38 kg/m  Wt Readings from Last 3 Encounters:  06/09/21 225 lb 11.2 oz (102.4 kg)  01/30/21 229 lb 9.6 oz (104.1 kg)  10/03/20 231 lb (104.8 kg)     Health Maintenance Due  Topic Date Due   HIV Screening  Never done   Hepatitis C Screening  Never done    There are no preventive care reminders to display for this patient.  Lab Results  Component Value Date   TSH 0.59 10/03/2020   Lab Results  Component Value  Date   WBC 5.5 10/03/2020   HGB 15.1 10/03/2020   HCT 42.9 10/03/2020   MCV 91.5 10/03/2020   PLT 252.0 10/03/2020   Lab Results  Component Value Date   NA 139 10/03/2020   K 4.2 10/03/2020   CO2 25 10/03/2020   GLUCOSE 93 10/03/2020   BUN 16 10/03/2020  CREATININE 1.04 10/03/2020   BILITOT 0.5 02/03/2021   ALKPHOS 89 02/03/2021   AST 31 02/03/2021   ALT 68 (H) 02/03/2021   PROT 7.4 02/03/2021   ALBUMIN 5.0 02/03/2021   CALCIUM 9.5 10/03/2020   GFR 92.62 10/03/2020   Lab Results  Component Value Date   CHOL 192 09/18/2018   Lab Results  Component Value Date   HDL 28.10 (L) 09/18/2018   No results found for: [facility] Lab Results  Component Value Date   TRIG 280.0 (H) 09/18/2018   Lab Results  Component Value Date   CHOLHDL 7 09/18/2018   Lab Results  Component Value Date   HGBA1C 5.3 05/07/2019      Assessment & Plan:   Migraine headaches with occasional aura.  Frequency generally only 2 to 3/month but these can be prolonged lasting a few days when he does not take Imitrex on time  -Refill Imitrex 100 mg to use as needed -We discussed preventative medications.  He is already on Effexor.  We discussed possible trial of Topamax 25 mg nightly for 1 week and then titrate to 50 mg nightly.  Could also further titrate to twice daily but he will try once daily use to see if this works.  Also consider food diary to look for triggers -We also briefly discussed other potential prophylactic medications including beta-blockers for low-dose tricyclic's  Meds ordered this encounter  Medications   SUMAtriptan (IMITREX) 100 MG tablet    Sig: Take 1 tablet at onset of migraine and may repeat 1 in 2 hours as needed for persistent headache but no more than 2 in 24 hours    Dispense:  9 tablet    Refill:  5   topiramate (TOPAMAX) 25 MG tablet    Sig: Take one capsule by mouth at night for one week and then titrate to two at night    Dispense:  60 tablet    Refill:  5     Follow-up: No follow-ups on file.    Carolann Littler, MD

## 2021-06-15 ENCOUNTER — Other Ambulatory Visit: Payer: Self-pay | Admitting: Family Medicine

## 2021-06-22 ENCOUNTER — Other Ambulatory Visit: Payer: Self-pay | Admitting: Family Medicine

## 2021-07-22 ENCOUNTER — Other Ambulatory Visit: Payer: Self-pay | Admitting: Orthopedic Surgery

## 2021-07-22 DIAGNOSIS — M2351 Chronic instability of knee, right knee: Secondary | ICD-10-CM

## 2021-08-01 ENCOUNTER — Other Ambulatory Visit: Payer: Self-pay

## 2021-08-01 ENCOUNTER — Ambulatory Visit
Admission: RE | Admit: 2021-08-01 | Discharge: 2021-08-01 | Disposition: A | Discharge status: Home / Self Care | Payer: BC Managed Care – PPO | Source: Ambulatory Visit | Attending: Orthopedic Surgery | Admitting: Orthopedic Surgery

## 2021-08-01 DIAGNOSIS — M2351 Chronic instability of knee, right knee: Secondary | ICD-10-CM

## 2021-08-28 ENCOUNTER — Telehealth: Payer: BC Managed Care – PPO | Admitting: Physician Assistant

## 2021-08-28 ENCOUNTER — Encounter (INDEPENDENT_AMBULATORY_CARE_PROVIDER_SITE_OTHER): Payer: Self-pay

## 2021-08-28 DIAGNOSIS — R197 Diarrhea, unspecified: Secondary | ICD-10-CM

## 2021-08-28 MED ORDER — CIPROFLOXACIN HCL 500 MG PO TABS: 500.0000 mg | ORAL_TABLET | Freq: Two times a day (BID) | ORAL | 0 refills | Status: AC

## 2021-08-28 NOTE — Progress Notes (Signed)
We are sorry that you are not feeling well.  Here is how we plan to help!  Based on what you have shared with me it looks like you have Acute Infectious Diarrhea, most likely traveler's diarrhea.  Most cases of acute diarrhea are due to infections with virus and bacteria and are self-limited conditions lasting less than 14 days.  For your symptoms you may take Imodium 2 mg tablets that are over the counter at your local pharmacy. Take two tablet now and then one after each loose stool up to 6 a day.  Antibiotics are not needed for most people with diarrhea.  Optional: I have prescribed Cipro 500 mg twice a day for seven days  I will prescribe a work note, but we are unable to back date notes. I can provide a note for today and tomorrow.   HOME CARE We recommend changing your diet to help with your symptoms for the next few days. Drink plenty of fluids that contain water salt and sugar. Sports drinks such as Gatorade may help.  You may try broths, soups, bananas, applesauce, soft breads, mashed potatoes or crackers.  You are considered infectious for as long as the diarrhea continues. Hand washing or use of alcohol based hand sanitizers is recommend. It is best to stay out of work or school until your symptoms stop.   GET HELP RIGHT AWAY If you have dark yellow colored urine or do not pass urine frequently you should drink more fluids.   If your symptoms worsen  If you feel like you are going to pass out (faint) You have a new problem  MAKE SURE YOU  Understand these instructions. Will watch your condition. Will get help right away if you are not doing well or get worse.  Thank you for choosing an e-visit.  Your e-visit answers were reviewed by a board certified advanced clinical practitioner to complete your personal care plan. Depending upon the condition, your plan could have included both over the counter or prescription medications.  Please review your pharmacy choice. Make  sure the pharmacy is open so you can pick up prescription now. If there is a problem, you may contact your provider through [employer] and have the prescription routed to another pharmacy.  Your safety is important to [place]. If you have drug allergies check your prescription carefully.   For the next 24 hours you can use MyChart to ask questions about today's visit, request a non-urgent call back, or ask for a work or school excuse. You will get an email in the next two days asking about your experience. I hope that your e-visit has been valuable and will speed your recovery.  I provided 5 minutes of non face-to-face time during this encounter for chart review and documentation.

## 2021-09-18 ENCOUNTER — Encounter: Payer: Self-pay | Admitting: Family Medicine

## 2021-10-02 ENCOUNTER — Encounter: Payer: Self-pay | Admitting: *Deleted

## 2021-10-08 ENCOUNTER — Encounter: Payer: Self-pay | Admitting: Family Medicine

## 2021-10-08 ENCOUNTER — Ambulatory Visit (INDEPENDENT_AMBULATORY_CARE_PROVIDER_SITE_OTHER): Payer: BC Managed Care – PPO | Admitting: Family Medicine

## 2021-10-08 VITALS — BP 124/94 | HR 101 | Temp 98.2°F | Wt 219.4 lb

## 2021-10-08 DIAGNOSIS — H6123 Impacted cerumen, bilateral: Secondary | ICD-10-CM | POA: Diagnosis not present

## 2021-10-08 DIAGNOSIS — R051 Acute cough: Secondary | ICD-10-CM | POA: Diagnosis not present

## 2021-10-08 DIAGNOSIS — J40 Bronchitis, not specified as acute or chronic: Secondary | ICD-10-CM

## 2021-10-08 DIAGNOSIS — R0982 Postnasal drip: Secondary | ICD-10-CM | POA: Diagnosis not present

## 2021-10-08 MED ORDER — AZITHROMYCIN 250 MG PO TABS: ORAL_TABLET | ORAL | 0 refills | Status: AC

## 2021-10-08 MED ORDER — ALBUTEROL SULFATE HFA 108 (90 BASE) MCG/ACT IN AERS: 2.0000 | INHALATION_SPRAY | Freq: Four times a day (QID) | RESPIRATORY_TRACT | 0 refills | Status: DC | PRN

## 2021-10-08 NOTE — Progress Notes (Signed)
Subjective:  ? ? Patient ID: Timothy Foster, male    DOB: 1986-03-23, 36 y.o.   MRN: [id number] ? ?Chief Complaint  ?Patient presents with  ? Cough  ?  Productive cough, thick and green. Going on for 2 weeks. Throat is scratchy, from the cough  ? ? ?HPI ?Patient was seen today for ongoing concern.  Patient endorses productive cough x2 weeks and occasional wheezing.  Patient notes cough has become worse in the last 5-6 days.  Tried OTC Mucinex DM and Sudafed without relief.  Takes Allegra daily. ? ?Past Medical History:  ?Diagnosis Date  ? ADD (attention deficit disorder)   ? Allergy   ? Anxiety   ? B12 deficiency   ? Chicken pox   ? Depression   ? GERD (gastroesophageal reflux disease)   ? Headache(784.0)   ? Hyperlipidemia   ? Internal hemorrhoids   ? Migraine   ? ? ?Allergies  ?Allergen Reactions  ? Penicillins Anaphylaxis  ?  Hives as a child ?Hives as a child  ? ? ?ROS ?General: Denies fever, chills, night sweats, changes in weight, changes in appetite ?HEENT: Denies headaches, ear pain, changes in vision, rhinorrhea, sore throat ?CV: Denies CP, palpitations, SOB, orthopnea ?Pulm: Denies SOB, wheezing  +cough, intermittent wheezing ?GI: Denies abdominal pain, nausea, vomiting, diarrhea, constipation ?GU: Denies dysuria, hematuria, frequency, vaginal discharge ?Msk: Denies muscle cramps, joint pains ?Neuro: Denies weakness, numbness, tingling ?Skin: Denies rashes, bruising ?Psych: Denies depression, anxiety, hallucinations ? ?   ?Objective:  ?  ?Blood pressure (!) 124/94, pulse (!) 101, temperature 98.2 ?F (36.8 ?C), temperature source Oral, weight 219 lb 6.4 oz (99.5 kg), SpO2 99 %. ? ?Gen. Pleasant, well-nourished, in no distress, normal affect   ?HEENT: [place]/AT, face symmetric, conjunctiva clear, no scleral icterus, PERRLA, EOMI, nares patent without drainage, pharynx with clear postnasal drainage, no erythema or exudate.  TMs bilaterally occluded with impacted cerumen unable to visualize TM. ?Lungs: Cough, no  accessory muscle use, CTAB, no wheezes or rales ?Cardiovascular: RRR, no m/r/g, no peripheral edema ?Musculoskeletal: No deformities, no cyanosis or clubbing, normal tone ?Neuro:  A&Ox3, CN II-XII intact, normal gait ?Skin:  Warm, no lesions/ rash ? ? ?Wt Readings from Last 3 Encounters:  ?06/09/21 225 lb 11.2 oz (102.4 kg)  ?01/30/21 229 lb 9.6 oz (104.1 kg)  ?10/03/20 231 lb (104.8 kg)  ? ? ?Lab Results  ?Component Value Date  ? WBC 5.5 10/03/2020  ? HGB 15.1 10/03/2020  ? HCT 42.9 10/03/2020  ? PLT 252.0 10/03/2020  ? GLUCOSE 93 10/03/2020  ? CHOL 192 09/18/2018  ? TRIG 280.0 (H) 09/18/2018  ? HDL 28.10 (L) 09/18/2018  ? LDLDIRECT 138.0 09/18/2018  ? ALT 68 (H) 02/03/2021  ? AST 31 02/03/2021  ? NA 139 10/03/2020  ? K 4.2 10/03/2020  ? CL 106 10/03/2020  ? CREATININE 1.04 10/03/2020  ? BUN 16 10/03/2020  ? CO2 25 10/03/2020  ? TSH 0.59 10/03/2020  ? HGBA1C 5.3 05/07/2019  ? ? ?Assessment/Plan: ? ?Acute cough ?-continue OTC antihistamines, cough/cold meds, warm fluids, honey, etc. ?-Avoid decongestants as may affect blood pressure. ? ?Bronchitis  ?-Productive cough x2 weeks with occasional wheezing.  Lungs clear on exam. ?-Treat as bronchitis ?-Also discussed OTC antihistamines.  Patient to try something other than Allegra as may have built up a tolerance. ?- Plan: albuterol (VENTOLIN HFA) 108 (90 Base) MCG/ACT inhaler, azithromycin (ZITHROMAX) 250 MG tablet ? ?Post-nasal drainage ?-Discussed switching from Allegra to a different allergy medicine since using  times years ? ?Bilateral impacted cerumen ?-OTC Debrox eardrops ?-Given handout ? ?F/u prn  ? ?Grier Mitts, MD ?

## 2021-10-09 ENCOUNTER — Ambulatory Visit: Payer: BC Managed Care – PPO | Admitting: Physician Assistant

## 2021-10-15 NOTE — Progress Notes (Signed)
? ? ? ?10/20/2021 ?Timothy Foster ?[id number] ?12/28/1985 ? ? ?ASSESSMENT AND PLAN:  ? ?Internal hemorrhoids ?08/07/2013 Colonoscopy, good prep, normal exam, small internal hemorrhoids, neg pathology repeat age 36.  ?-     hydrocortisone (ANUSOL-HC) 2.5 % rectal cream; Place 1 application. rectally 2 (two) times daily as needed for hemorrhoids. ?-Sitz baths, increase fiber, increase water, need to fix toilet habits.  ?-Hydrocortisone supp give and external cream sent in.  ?-We discussed hemorrhoid banding here in the office or surgical treatment if hemorrhoids do not improve with conservative treatment. ?- follow up for evaluation- patient wishes to call if not better ? ? ?Patient Care Team: ?Timothy Post, MD as PCP - General (Family Medicine) ?Timothy Dresser, MD as PCP - Cardiology (Cardiology) ? ?HISTORY OF PRESENT ILLNESS: ?36 y.o. 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  ?Known to Dr. [name].  ?08/07/2013 Colonoscopy, good prep, normal exam, small internal hemorrhoids, neg pathology repeat age 62.  ?06/19/2013 negative celiac ? ?Prior to this had loose stools 1-2 x a day, BM's are the same.  ?Beginning of feb had diarrhea and vomiting x 1 week,, diarrhea multiple times a day.  ?A few days after the diarrhea then started to have bloody stools, has been better last 2-3 days.  ?BRB in the toilet.  ?Tries to take gummy fiber daily and if he remembers helps his stool.   ?No AB pain, nausea, vomiting,.  ? ?Current Medications:  ? ? ?Current Outpatient Medications (Cardiovascular):  ?  fenofibrate 160 MG tablet, TAKE 1 TABLET DAILY (PLEASE SCHEDULE AN APPOINTMENT WITH LABS FOR REFILLS [phone]) ?  telmisartan (MICARDIS) 40 MG tablet, TAKE 1 TABLET BY MOUTH EVERY DAY ? ?Current Outpatient Medications  (Respiratory):  ?  fexofenadine (ALLEGRA) 180 MG tablet, Take 1 tablet (180 mg total) by mouth daily. ?  fluticasone (FLONASE) 50 MCG/ACT nasal spray, Place into both nostrils daily as needed for allergies or rhinitis. ? ?Current Outpatient Medications (Analgesics):  ?  SUMAtriptan (IMITREX) 100 MG tablet, Take 1 tablet at onset of migraine and may repeat 1 in 2 hours as needed for persistent headache but no more than 2 in 24 hours ? ? ?Current Outpatient Medications (Other):  ?  hydrocortisone (ANUSOL-HC) 2.5 % rectal cream, Place 1 application. rectally 2 (two) times daily as needed for hemorrhoids. ?  omeprazole (PRILOSEC) 40 MG capsule, TAKE 1 CAPSULE DAILY ?  topiramate (TOPAMAX) 25 MG tablet, Take one capsule by mouth at night for one week and then titrate to two at night ?  venlafaxine XR (EFFEXOR-XR) 150 MG 24 hr capsule, TAKE 1 CAPSULE DAILY (NEEDS A PHYSICAL FOR FURTHER REFILLS) ? ?Medical History:  ?Past Medical History:  ?Diagnosis Date  ? ADD (attention deficit disorder)   ? Allergy   ? Anxiety   ? B12 deficiency   ? Chicken pox   ? Depression   ? GERD (gastroesophageal reflux disease)   ? Headache(784.0)   ? Hyperlipidemia   ? Internal hemorrhoids   ? Migraine   ? ?Allergies:  ?Allergies  ?Allergen Reactions  ? Penicillins Anaphylaxis  ?  Hives as a child ?Hives as a child  ?  ? ?Surgical History:  ?He  has a past surgical history that includes Wisdom tooth extraction. ?Family History:  ?His family history includes Alcohol abuse in his maternal  uncle and paternal uncle; Cancer in his paternal uncle; Colon cancer in his paternal uncle; Crohn's disease in his maternal aunt; Diabetes in his father and mother; Esophageal cancer in his father; Pancreatic cancer in his paternal uncle; Stomach cancer in his paternal grandfather; Stroke in his father. ?Social History:  ? reports that he has never smoked. He has never used smokeless tobacco. He reports current alcohol use. He reports that he does not use  drugs. ? ?REVIEW OF SYSTEMS  : All other systems reviewed and negative except where noted in the History of Present Illness. ? ? ?PHYSICAL EXAM: ?BP 120/60   Pulse (!) 101   Ht '5\' 10"'$  (1.778 m)   Wt 223 lb (101.2 kg)   SpO2 95%   BMI 32.00 kg/m?  ?General:   Pleasant, well developed male in no acute distress ?Head:  Normocephalic and atraumatic. ?Eyes: sclerae anicteric,conjunctive pink  ?Heart:  regular rate and rhythm ?Pulm: Clear anteriorly; no wheezing ?Abdomen:  Soft, Obese AB, skin exam , Normal bowel sounds.  no  tenderness . Without guarding and Without rebound, without hepatomegaly. ?Rectal: Normal external rectal exam other than small skin tag, normal rectal tone, + internal hemorrhoids appreciated, no masses, non tender, brown stool, hemoccult positive.  ?Extremities:  Without edema. ?Msk:  Symmetrical without gross deformities. Peripheral pulses intact.  ?Neurologic:  Alert and  oriented x4;  grossly normal neurologically. ?Skin:   Dry and intact without significant lesions or rashes. ?Psychiatric: Demonstrates good judgement and reason without abnormal affect or behaviors. ? ? ?Vladimir Crofts, PA-C ?11:05 AM ? ? ?

## 2021-10-20 ENCOUNTER — Ambulatory Visit (INDEPENDENT_AMBULATORY_CARE_PROVIDER_SITE_OTHER): Payer: BC Managed Care – PPO | Admitting: Physician Assistant

## 2021-10-20 ENCOUNTER — Encounter: Payer: Self-pay | Admitting: Physician Assistant

## 2021-10-20 VITALS — BP 120/60 | HR 101 | Ht 70.0 in | Wt 223.0 lb

## 2021-10-20 DIAGNOSIS — K648 Other hemorrhoids: Secondary | ICD-10-CM | POA: Diagnosis not present

## 2021-10-20 MED ORDER — HYDROCORTISONE (PERIANAL) 2.5 % EX CREA: 1.0000 "application " | TOPICAL_CREAM | Freq: Two times a day (BID) | CUTANEOUS | 1 refills | Status: DC | PRN

## 2021-10-20 NOTE — Patient Instructions (Signed)
Apply a pea size amount of over the counter Anusol HC cream to the tip of an over the counter PrepH suppository and insert rectally once every night for at least 7 nights.  ? ?Please do sitz baths, increase fiber or add benefiber, increase water and increase acitivity.  ? ?About Hemorrhoids ? ?Hemorrhoids are swollen veins in the lower rectum and anus.  Also called piles, hemorrhoids are a common problem.  Hemorrhoids may be internal (inside the rectum) or external (around the anus). ? ?Internal Hemorrhoids ? ?Internal hemorrhoids are often painless, but they rarely cause bleeding.  The internal veins may stretch and fall down (prolapse) through the anus to the outside of the body.  The veins may then become irritated and painful. ? ?External Hemorrhoids ? ?External hemorrhoids can be easily seen or felt around the anal opening.  They are under the skin around the anus.  When the swollen veins are scratched or broken by straining, rubbing or wiping they sometimes bleed. ? ?How Hemorrhoids Occur ? ?Veins in the rectum and around the anus tend to swell under pressure.  Hemorrhoids can result from increased pressure in the veins of your anus or rectum.  Some sources of pressure are: ? ?Straining to have a bowel movement because of constipation ?Waiting too long to have a bowel movement ?Coughing and sneezing often ?Sitting for extended periods of time, including on the toilet ?Diarrhea ?Obesity ?Trauma or injury to the anus ?Some liver diseases ?Stress ?Family history of hemorrhoids ?Pregnancy ? Pregnant women should try to avoid becoming constipated, because they are more likely to have hemorrhoids during pregnancy.  In the last trimester of pregnancy, the enlarged uterus may press on blood vessels and causes hemorrhoids.  In addition, the strain of childbirth sometimes causes hemorrhoids after the birth. ? ?Symptoms of Hemorrhoids ? ?Some symptoms of hemorrhoids include: ?Swelling and/or a tender lump around the  anus ?Itching, mild burning and bleeding around the anus ?Painful bowel movements with or without constipation ?Bright red blood covering the stool, on toilet paper or in the toilet bowel.  ? ?Symptoms usually go away within a few days.  Always talk to your doctor about any bleeding to make sure it is not from some other causes. ? ?Diagnosing and Treating Hemorrhoids ? ?Diagnosis is made by an examination by your healthcare provider.  Special test can be performed by your doctor.   ? ?Most cases of hemorrhoids can be treated with: ?High-fiber diet: Eat more high-fiber foods, which help prevent constipation.  Ask for more detailed fiber information on types and sources of fiber from your healthcare provider. ?Fluids: Drink plenty of water.  This helps soften bowel movements so they are easier to pass. ?Sitz baths and cold packs: Sitting in lukewarm water two or three times a day for 15 minutes cleases the anal area and may relieve discomfort.  If the water is too hot, swelling around the anus will get worse.  Placing a cloth-covered ice pack on the anus for ten minutes four times a day can also help reduce selling.  Gently pushing a prolapsed hemorrhoid back inside after the bath or ice pack can be helpful. ?Medications: For mild discomfort, your healthcare provider may suggest over-the-counter pain medication or prescribe a cream or ointment for topical use.  The cream may contain witch hazel, zinc oxide or petroleum jelly.  Medicated suppositories are also a treatment option.  Always consult your doctor before applying medications or creams. ?Procedures and surgeries: There are also  a number of procedures and surgeries to shrink or remove hemorrhoids in more serious cases.  Talk to your physician about these options. ? ?You can often prevent hemorrhoids or keep them from becoming worse by maintaining a healthy lifestyle.  Eat a fiber-rich diet of fruits, vegetables and whole grains.  Also, drink plenty of water and  exercise regularly. ? ?? 2007, [employer] Doc.30 ? ?

## 2021-11-19 ENCOUNTER — Other Ambulatory Visit: Payer: Self-pay | Admitting: Family Medicine

## 2021-12-30 ENCOUNTER — Other Ambulatory Visit: Payer: Self-pay | Admitting: Family Medicine

## 2022-01-02 ENCOUNTER — Other Ambulatory Visit: Payer: Self-pay | Admitting: Family Medicine

## 2022-02-09 ENCOUNTER — Encounter: Payer: Self-pay | Admitting: Family Medicine

## 2022-02-09 ENCOUNTER — Ambulatory Visit (INDEPENDENT_AMBULATORY_CARE_PROVIDER_SITE_OTHER): Payer: BC Managed Care – PPO | Admitting: Family Medicine

## 2022-02-09 VITALS — BP 110/72 | HR 72 | Temp 97.5°F | Ht 70.0 in | Wt 220.4 lb

## 2022-02-09 DIAGNOSIS — G43109 Migraine with aura, not intractable, without status migrainosus: Secondary | ICD-10-CM

## 2022-02-09 MED ORDER — TOPIRAMATE 25 MG PO TABS: ORAL_TABLET | ORAL | 3 refills | Status: DC

## 2022-02-09 MED ORDER — SUMATRIPTAN SUCCINATE 100 MG PO TABS: ORAL_TABLET | ORAL | 11 refills | Status: DC

## 2022-02-09 NOTE — Progress Notes (Signed)
Established Patient Office Visit  Subjective   Patient ID: Timothy Foster, male    DOB: February 09, 1986  Age: 36 y.o. MRN: [id number]  Chief Complaint  Patient presents with   FMLA    HPI   Longstanding history of migraine headache with aura.  He brings in some FMLA paperwork to complete today.  He works as a Catering manager and still gets about 2 migraines per month.  These usually respond well to Imitrex but not consistently.  He is on Topamax with taking 50 mg nightly as seen reduction in migraines.  He would like to consider further titration.  Usually with migraine flares gets prompt relief with Imitrex which can last for hours occasionally.  Tolerating Topamax without any major side effects.  Past Medical History:  Diagnosis Date   ADD (attention deficit disorder)    Allergy    Anxiety    B12 deficiency    Chicken pox    Depression    GERD (gastroesophageal reflux disease)    Headache(784.0)    Hyperlipidemia    Internal hemorrhoids    Migraine    Past Surgical History:  Procedure Laterality Date   WISDOM TOOTH EXTRACTION      reports that he has never smoked. He has never used smokeless tobacco. He reports current alcohol use. He reports that he does not use drugs. family history includes Alcohol abuse in his maternal uncle and paternal uncle; Cancer in his paternal uncle; Colon cancer in his paternal uncle; Crohn's disease in his maternal aunt; Diabetes in his father and mother; Esophageal cancer in his father; Pancreatic cancer in his paternal uncle; Stomach cancer in his paternal grandfather; Stroke in his father. Allergies  Allergen Reactions   Penicillins Anaphylaxis    Hives as a child Hives as a child    Review of Systems  Constitutional:  Negative for chills and fever.  Cardiovascular:  Negative for chest pain.  Neurological:  Negative for dizziness.      Objective:     BP 110/72 (BP Location: Left Arm, Patient Position: Sitting, Cuff Size: Normal)    Pulse 72   Temp (!) 97.5 F (36.4 C) (Oral)   Ht '5\' 10"'$  (1.778 m)   Wt 220 lb 6.4 oz (100 kg)   SpO2 98%   BMI 31.62 kg/m    Physical Exam Vitals reviewed.  Cardiovascular:     Rate and Rhythm: Normal rate and regular rhythm.     Heart sounds: No murmur heard. Pulmonary:     Effort: Pulmonary effort is normal.     Breath sounds: Normal breath sounds.  Neurological:     General: No focal deficit present.     Cranial Nerves: No cranial nerve deficit.      No results found for any visits on 02/09/22.    The ASCVD Risk score (Arnett DK, et al., 2019) failed to calculate for the following reasons:   The 2019 ASCVD risk score is only valid for ages 57 to 21    Assessment & Plan:   Problem List Items Addressed This Visit       Unprioritized   Migraine headache with aura - Primary   Relevant Medications   topiramate (TOPAMAX) 25 MG tablet   SUMAtriptan (IMITREX) 100 MG tablet  Migraine headaches improved with Topamax.  Titrate further to 75 mg daily.  Refill Imitrex for as needed use.  He knows to take no more than 2 per 24 hours  FMLA paperwork completed  No follow-ups  on file.    Carolann Littler, MD

## 2022-02-24 ENCOUNTER — Other Ambulatory Visit: Payer: Self-pay | Admitting: Family Medicine

## 2022-03-30 ENCOUNTER — Other Ambulatory Visit: Payer: Self-pay | Admitting: Family Medicine

## 2022-04-01 ENCOUNTER — Other Ambulatory Visit: Payer: Self-pay | Admitting: Family Medicine

## 2022-05-25 ENCOUNTER — Other Ambulatory Visit: Payer: Self-pay | Admitting: Family Medicine

## 2022-06-03 ENCOUNTER — Encounter: Payer: Self-pay | Admitting: Family Medicine

## 2022-06-04 ENCOUNTER — Other Ambulatory Visit: Payer: Self-pay

## 2022-06-04 MED ORDER — TADALAFIL 10 MG PO TABS: ORAL_TABLET | ORAL | 1 refills | Status: AC

## 2022-06-08 ENCOUNTER — Other Ambulatory Visit: Payer: Self-pay | Admitting: Family Medicine

## 2022-06-26 ENCOUNTER — Other Ambulatory Visit: Payer: Self-pay | Admitting: Family Medicine

## 2022-06-28 ENCOUNTER — Other Ambulatory Visit: Payer: Self-pay | Admitting: Family Medicine

## 2022-08-01 ENCOUNTER — Telehealth: Payer: BC Managed Care – PPO | Admitting: Family

## 2022-08-01 DIAGNOSIS — J069 Acute upper respiratory infection, unspecified: Secondary | ICD-10-CM | POA: Diagnosis not present

## 2022-08-01 MED ORDER — BENZONATATE 100 MG PO CAPS: 100.0000 mg | ORAL_CAPSULE | Freq: Three times a day (TID) | ORAL | 0 refills | Status: DC | PRN

## 2022-08-01 MED ORDER — FLUTICASONE PROPIONATE 50 MCG/ACT NA SUSP: 2.0000 | Freq: Every day | NASAL | 6 refills | Status: DC

## 2022-08-01 NOTE — Progress Notes (Signed)

## 2022-08-06 ENCOUNTER — Telehealth: Payer: BC Managed Care – PPO | Admitting: Physician Assistant

## 2022-08-06 DIAGNOSIS — R051 Acute cough: Secondary | ICD-10-CM

## 2022-08-06 NOTE — Progress Notes (Signed)
Sent patient a question and no answer received before scheduled time off.

## 2022-08-07 ENCOUNTER — Encounter: Payer: Self-pay | Admitting: Nurse Practitioner

## 2022-08-07 MED ORDER — BENZONATATE 100 MG PO CAPS: 100.0000 mg | ORAL_CAPSULE | Freq: Three times a day (TID) | ORAL | 0 refills | Status: DC | PRN

## 2022-08-07 MED ORDER — AZITHROMYCIN 250 MG PO TABS: ORAL_TABLET | ORAL | 0 refills | Status: DC

## 2022-08-07 NOTE — Progress Notes (Signed)

## 2022-08-07 NOTE — Addendum Note (Signed)
Addended by: Chevis Pretty on: 08/07/2022 07:18 AM   Modules accepted: Orders

## 2022-08-17 ENCOUNTER — Other Ambulatory Visit: Payer: Self-pay

## 2022-08-17 ENCOUNTER — Encounter: Payer: Self-pay | Admitting: Family Medicine

## 2022-08-17 DIAGNOSIS — F5104 Psychophysiologic insomnia: Secondary | ICD-10-CM

## 2022-08-17 MED ORDER — TRAZODONE HCL 50 MG PO TABS: 50.0000 mg | ORAL_TABLET | Freq: Every evening | ORAL | 0 refills | Status: DC | PRN

## 2022-08-20 ENCOUNTER — Ambulatory Visit: Payer: Self-pay | Admitting: Family Medicine

## 2022-08-23 ENCOUNTER — Other Ambulatory Visit: Payer: Self-pay | Admitting: Family Medicine

## 2022-09-01 ENCOUNTER — Ambulatory Visit (INDEPENDENT_AMBULATORY_CARE_PROVIDER_SITE_OTHER): Payer: BC Managed Care – PPO | Admitting: Family Medicine

## 2022-09-01 ENCOUNTER — Encounter: Payer: Self-pay | Admitting: Family Medicine

## 2022-09-01 VITALS — BP 108/74 | HR 72 | Temp 97.8°F | Ht 70.0 in | Wt 221.8 lb

## 2022-09-01 DIAGNOSIS — I10 Essential (primary) hypertension: Secondary | ICD-10-CM | POA: Diagnosis not present

## 2022-09-01 DIAGNOSIS — E785 Hyperlipidemia, unspecified: Secondary | ICD-10-CM | POA: Diagnosis not present

## 2022-09-01 DIAGNOSIS — G5711 Meralgia paresthetica, right lower limb: Secondary | ICD-10-CM | POA: Diagnosis not present

## 2022-09-01 DIAGNOSIS — E781 Pure hyperglyceridemia: Secondary | ICD-10-CM | POA: Diagnosis not present

## 2022-09-01 DIAGNOSIS — R208 Other disturbances of skin sensation: Secondary | ICD-10-CM

## 2022-09-01 DIAGNOSIS — Q825 Congenital non-neoplastic nevus: Secondary | ICD-10-CM

## 2022-09-01 LAB — HEPATIC FUNCTION PANEL
ALT: 44 U/L (ref 0–53)
AST: 22 U/L (ref 0–37)
Albumin: 4.8 g/dL (ref 3.5–5.2)
Alkaline Phosphatase: 66 U/L (ref 39–117)
Bilirubin, Direct: 0 mg/dL (ref 0.0–0.3)
Total Bilirubin: 0.4 mg/dL (ref 0.2–1.2)
Total Protein: 7.3 g/dL (ref 6.0–8.3)

## 2022-09-01 LAB — BASIC METABOLIC PANEL
BUN: 16 mg/dL (ref 6–23)
CO2: 25 mEq/L (ref 19–32)
Calcium: 9.8 mg/dL (ref 8.4–10.5)
Chloride: 108 mEq/L (ref 96–112)
Creatinine, Ser: 1.42 mg/dL (ref 0.40–1.50)
GFR: 62.89 mL/min (ref >60.00)
Glucose, Bld: 102 mg/dL — ABNORMAL HIGH (ref 70–99)
Potassium: 4.1 mEq/L (ref 3.5–5.1)
Sodium: 140 mEq/L (ref 135–145)

## 2022-09-01 LAB — LIPID PANEL
Cholesterol: 182 mg/dL (ref 0–200)
HDL: 22 mg/dL — ABNORMAL LOW (ref >39.00)
Total CHOL/HDL Ratio: 8
Triglycerides: 559 mg/dL — ABNORMAL HIGH (ref 0.0–149.0)

## 2022-09-01 LAB — LDL CHOLESTEROL, DIRECT: Direct LDL: 94 mg/dL

## 2022-09-01 NOTE — Progress Notes (Signed)
Established Patient Office Visit  Subjective   Patient ID: Timothy Foster, male    DOB: 23-Jun-1986  Age: 37 y.o. MRN: [id number]  Chief Complaint  Patient presents with   Nevus    Patient complains of mole of right leg.    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  No recent changes except for the fact that he has some surrounding areas of numbness just above this.  He has not noted any pigment change or irregularity.  No bleeding.  He relates about 98-monthhistory of some intermittent numbness and dysesthesias and occasional burning sensation along his mid lateral thigh.  No visible rash.  No low back pain.  Denies any lower extremity weakness.  Symptoms are relatively mild.  Burning is 2 out of 10 in severity and comes and goes.  He has history of hyperlipidemia.  Requesting follow-up lipids.  He is fasting today.  He has particularly elevated triglycerides in the past.  No known family history of premature CAD.  Past Medical History:  Diagnosis Date   ADD (attention deficit disorder)    Allergy    Anxiety    B12 deficiency    Chicken pox    Depression    GERD (gastroesophageal reflux disease)    Headache(784.0)    Hyperlipidemia    Internal hemorrhoids    Migraine    Past Surgical History:  Procedure Laterality Date   WISDOM TOOTH EXTRACTION      reports that he has never smoked. He has never used smokeless tobacco. He reports current alcohol use. He reports that he does not use drugs. family history includes Alcohol abuse in his maternal uncle and paternal uncle; Cancer in his paternal uncle; Colon cancer in his paternal uncle; Crohn's disease in his maternal aunt; Diabetes in his father and mother; Esophageal cancer in his father; Pancreatic cancer in his paternal uncle; Stomach  cancer in his paternal grandfather; Stroke in his father. Allergies  Allergen Reactions   Penicillins Anaphylaxis    Hives as a child Hives as a child    Review of Systems  Constitutional:  Negative for malaise/fatigue.  Eyes:  Negative for blurred vision.  Respiratory:  Negative for shortness of breath.   Cardiovascular:  Negative for chest pain.  Neurological:  Positive for tingling and sensory change. Negative for dizziness, focal weakness, weakness and headaches.      Objective:     BP 108/74 (BP Location: Left Arm, Patient Position: Sitting, Cuff Size: Normal)   Pulse 72   Temp 97.8 F (36.6 C) (Oral)   Ht '5\' 10"'$  (1.778 m)   Wt 221 lb 12.8 oz (100.6 kg)   SpO2 99%   BMI 31.82 kg/m  BP Readings from Last 3 Encounters:  09/01/22 108/74  02/09/22 110/72  10/20/21 120/60   Wt Readings from Last 3 Encounters:  09/01/22 221 lb 12.8 oz (100.6 kg)  02/09/22 220 lb 6.4 oz (100 kg)  10/20/21 223 lb (101.2 kg)      Physical Exam Vitals reviewed.  Constitutional:      General: He is not in acute distress. Cardiovascular:     Rate and Rhythm: Normal rate and regular rhythm.  Pulmonary:     Effort: Pulmonary effort is normal.  Breath sounds: Normal breath sounds. No wheezing or rales.  Skin:    Comments: Approximately 6 x 15 mm brownish congenital nevus right lower lateral anterior thigh.  Well-demarcated border.  Minimal color variegation.  Neurological:     General: No focal deficit present.     Mental Status: He is alert.     Comments: Minimal altered sensation with monofilament and light touch right lateral anterior thigh over a small area about 3 to 4 cm.  No evidence for any motor weakness.  He has full strength with knee extension, plantarflexion, and dorsiflexion.  Slightly diminished right knee reflex compared to the left.     No results found for any visits on 09/01/22.    The ASCVD Risk score (Arnett DK, et al., 2019) failed to calculate for the  following reasons:   The 2019 ASCVD risk score is only valid for ages 71 to 73    Assessment & Plan:   #1 congenital nevus right anterior thigh.  Benign features.  Watch closely for any change in contour, bleeding, itching, color changes, etc.  #2 dysesthesia right anterior/lateral thigh.  Suspect probably meralgia paresthetica.  Handout given.  Watch for any changes such as weakness or any progressive symptoms.  Reassurance given that this usually is self-limited and does not need any further workup at this time.  #3 history of dyslipidemia.  Recheck fasting lipids today along with hepatic panel.  #4 hypertension well-controlled on Micardis.  Continue Micardis 40 mg daily   No follow-ups on file.    Carolann Littler, MD

## 2022-09-06 ENCOUNTER — Other Ambulatory Visit: Payer: Self-pay | Admitting: Family Medicine

## 2022-09-23 ENCOUNTER — Other Ambulatory Visit: Payer: Self-pay | Admitting: Family Medicine

## 2022-09-27 ENCOUNTER — Other Ambulatory Visit: Payer: Self-pay | Admitting: Family Medicine

## 2022-11-01 ENCOUNTER — Encounter: Payer: Self-pay | Admitting: Family Medicine

## 2022-11-22 ENCOUNTER — Other Ambulatory Visit: Payer: Self-pay | Admitting: Family Medicine

## 2022-12-10 ENCOUNTER — Other Ambulatory Visit: Payer: Self-pay | Admitting: Family Medicine

## 2023-01-11 ENCOUNTER — Other Ambulatory Visit: Payer: Self-pay

## 2023-01-11 DIAGNOSIS — F5104 Psychophysiologic insomnia: Secondary | ICD-10-CM

## 2023-01-12 MED ORDER — TRAZODONE HCL 50 MG PO TABS
50.0000 mg | ORAL_TABLET | Freq: Every evening | ORAL | 1 refills | Status: DC | PRN
Start: 2023-01-12 — End: 2023-07-18

## 2023-01-22 ENCOUNTER — Other Ambulatory Visit: Payer: Self-pay | Admitting: Family Medicine

## 2023-01-25 ENCOUNTER — Telehealth: Payer: BC Managed Care – PPO | Admitting: Physician Assistant

## 2023-01-25 DIAGNOSIS — J019 Acute sinusitis, unspecified: Secondary | ICD-10-CM

## 2023-01-25 DIAGNOSIS — B9789 Other viral agents as the cause of diseases classified elsewhere: Secondary | ICD-10-CM

## 2023-01-25 MED ORDER — IPRATROPIUM BROMIDE 0.03 % NA SOLN
2.0000 | Freq: Two times a day (BID) | NASAL | 0 refills | Status: DC
Start: 2023-01-25 — End: 2023-11-23

## 2023-01-25 NOTE — Progress Notes (Signed)
I have spent 5 minutes in review of e-visit questionnaire, review and updating patient chart, medical decision making and response to patient.   Averianna Brugger Cody Ceazia Harb, PA-C    

## 2023-01-25 NOTE — Progress Notes (Signed)

## 2023-02-21 ENCOUNTER — Other Ambulatory Visit: Payer: Self-pay | Admitting: Family Medicine

## 2023-03-19 ENCOUNTER — Other Ambulatory Visit: Payer: Self-pay | Admitting: Family Medicine

## 2023-04-18 ENCOUNTER — Other Ambulatory Visit: Payer: Self-pay | Admitting: Family Medicine

## 2023-05-16 ENCOUNTER — Encounter: Payer: Self-pay | Admitting: Family Medicine

## 2023-05-17 DIAGNOSIS — Z0279 Encounter for issue of other medical certificate: Secondary | ICD-10-CM

## 2023-05-23 ENCOUNTER — Other Ambulatory Visit: Payer: Self-pay | Admitting: Family Medicine

## 2023-06-10 ENCOUNTER — Other Ambulatory Visit: Payer: Self-pay | Admitting: Family Medicine

## 2023-07-01 ENCOUNTER — Other Ambulatory Visit: Payer: Self-pay | Admitting: Family Medicine

## 2023-07-03 ENCOUNTER — Other Ambulatory Visit: Payer: Self-pay | Admitting: Family Medicine

## 2023-07-06 ENCOUNTER — Ambulatory Visit: Payer: BC Managed Care – PPO | Admitting: Family Medicine

## 2023-07-06 ENCOUNTER — Ambulatory Visit: Payer: BC Managed Care – PPO | Attending: Family Medicine

## 2023-07-06 VITALS — BP 130/96 | HR 83 | Temp 97.9°F | Ht 70.0 in | Wt 224.2 lb

## 2023-07-06 DIAGNOSIS — R002 Palpitations: Secondary | ICD-10-CM

## 2023-07-06 DIAGNOSIS — I493 Ventricular premature depolarization: Secondary | ICD-10-CM

## 2023-07-06 NOTE — Progress Notes (Unsigned)
EP to read

## 2023-07-06 NOTE — Progress Notes (Signed)
Established Patient Office Visit  Subjective   Patient ID: Timothy Foster, male    DOB: 19-Jul-1986  Age: 37 y.o. MRN: [id number]  Chief Complaint  Patient presents with   Palpitations    Patient complains of palpitations, x2 weeks, Patient does report slight shortness of breath    HPI   Timothy Foster is seen with at least 2-week history of increased palpitations.  These occur mostly at rest.  Did report some shortness of breath but apparently this is difficulty getting a deep breath and not exertional dyspnea.  No recent chest tightness.  Sense of irregular heartbeat intermittently.  Usually drinks about 1 cup of coffee per day.  Infrequent alcohol about twice per month.  Does have some stress issues.  No significant dizziness.  No syncope.  He had CT coronary morphology study 2020 with coronary calcium score of 0 with no significant plaque in any vessels.  Does have longstanding history of hypertriglyceridemia and very low HDL.  Past Medical History:  Diagnosis Date   ADD (attention deficit disorder)    Allergy    Anxiety    B12 deficiency    Chicken pox    Depression    GERD (gastroesophageal reflux disease)    Headache(784.0)    Hyperlipidemia    Internal hemorrhoids    Migraine    Past Surgical History:  Procedure Laterality Date   WISDOM TOOTH EXTRACTION      reports that he has never smoked. He has never used smokeless tobacco. He reports current alcohol use. He reports that he does not use drugs. family history includes Alcohol abuse in his maternal uncle and paternal uncle; Cancer in his paternal uncle; Colon cancer in his paternal uncle; Crohn's disease in his maternal aunt; Diabetes in his father and mother; Esophageal cancer in his father; Pancreatic cancer in his paternal uncle; Stomach cancer in his paternal grandfather; Stroke in his father. Allergies  Allergen Reactions   Penicillins Anaphylaxis    Hives as a child Hives as a child    Review of Systems   Constitutional:  Negative for malaise/fatigue.  Eyes:  Negative for blurred vision.  Respiratory:  Negative for hemoptysis.   Cardiovascular:  Positive for palpitations. Negative for chest pain and leg swelling.  Neurological:  Negative for dizziness, weakness and headaches.      Objective:     BP (!) 130/96 (BP Location: Left Arm, Patient Position: Sitting, Cuff Size: Normal)   Pulse 83   Temp 97.9 F (36.6 C) (Oral)   Ht 5\' 10"  (1.778 m)   Wt 224 lb 3.2 oz (101.7 kg)   SpO2 98%   BMI 32.17 kg/m  BP Readings from Last 3 Encounters:  07/06/23 (!) 130/96  09/01/22 108/74  02/09/22 110/72   Wt Readings from Last 3 Encounters:  07/06/23 224 lb 3.2 oz (101.7 kg)  09/01/22 221 lb 12.8 oz (100.6 kg)  02/09/22 220 lb 6.4 oz (100 kg)      Physical Exam Vitals reviewed.  Constitutional:      General: He is not in acute distress.    Appearance: He is not ill-appearing.  Cardiovascular:     Rate and Rhythm: Normal rate.     Heart sounds: No murmur heard.    Comments: Occasional infrequent premature beat Pulmonary:     Effort: Pulmonary effort is normal.     Breath sounds: Normal breath sounds. No wheezing or rales.  Neurological:     Mental Status: He is alert.  No results found for any visits on 07/06/23.  Last CBC Lab Results  Component Value Date   WBC 5.5 10/03/2020   HGB 15.1 10/03/2020   HCT 42.9 10/03/2020   MCV 91.5 10/03/2020   RDW 12.9 10/03/2020   PLT 252.0 10/03/2020   Last metabolic panel Lab Results  Component Value Date   GLUCOSE 102 (H) 09/01/2022   NA 140 09/01/2022   K 4.1 09/01/2022   CL 108 09/01/2022   CO2 25 09/01/2022   BUN 16 09/01/2022   CREATININE 1.42 09/01/2022   GFR 62.89 09/01/2022   CALCIUM 9.8 09/01/2022   PROT 7.3 09/01/2022   ALBUMIN 4.8 09/01/2022   BILITOT 0.4 09/01/2022   ALKPHOS 66 09/01/2022   AST 22 09/01/2022   ALT 44 09/01/2022   Last lipids Lab Results  Component Value Date   CHOL 182 09/01/2022    HDL 22.00 (L) 09/01/2022   LDLDIRECT 94.0 09/01/2022   TRIG (H) 09/01/2022    559.0 Triglyceride is over 400; calculations on Lipids are invalid.   CHOLHDL 8 09/01/2022   Last thyroid functions Lab Results  Component Value Date   TSH 0.59 10/03/2020      The ASCVD Risk score (Arnett DK, et al., 2019) failed to calculate for the following reasons:   The 2019 ASCVD risk score is only valid for ages 7 to 33    Assessment & Plan:   Problem List Items Addressed This Visit   None Visit Diagnoses     Palpitations    -  Primary   Relevant Orders   EKG 12-Lead (Completed)   LONG TERM MONITOR (3-14 DAYS)   PVC (premature ventricular contraction)         2-week history of increased palpitations mostly at rest.  EKG today shows sinus rhythm with occasional PVC.  Suspect he is probably having symptomatic PACs or PVCs.  Previous CT coronary morphology study unremarkable with coronary calcium score of 0.  We discussed the following:  -Continue to limit caffeine intake -Discussed other potential triggers such as alcohol and stress -Stay well-hydrated -Set up Zio patch monitor to further assess -Could use low-dose beta-blocker to suppress symptomatic PVCs or PACs but wait for monitoring for since symptoms are currently stable  No follow-ups on file.    Evelena Peat, MD

## 2023-07-06 NOTE — Patient Instructions (Addendum)
I will set up home Zio patch.  Consider setting up physical for sometime in the next few months.

## 2023-07-11 DIAGNOSIS — R002 Palpitations: Secondary | ICD-10-CM

## 2023-07-16 ENCOUNTER — Other Ambulatory Visit: Payer: Self-pay | Admitting: Family Medicine

## 2023-07-16 DIAGNOSIS — F5104 Psychophysiologic insomnia: Secondary | ICD-10-CM

## 2023-07-28 ENCOUNTER — Other Ambulatory Visit: Payer: Self-pay

## 2023-07-28 MED ORDER — TOPIRAMATE 25 MG PO TABS: ORAL_TABLET | ORAL | 0 refills | Status: DC

## 2023-07-28 MED ORDER — SUMATRIPTAN SUCCINATE 100 MG PO TABS: ORAL_TABLET | ORAL | 1 refills | Status: DC

## 2023-08-01 ENCOUNTER — Other Ambulatory Visit: Payer: Self-pay

## 2023-08-01 DIAGNOSIS — F5104 Psychophysiologic insomnia: Secondary | ICD-10-CM

## 2023-08-01 MED ORDER — TELMISARTAN 40 MG PO TABS: 40.0000 mg | ORAL_TABLET | Freq: Every day | ORAL | 0 refills | Status: DC

## 2023-08-02 MED ORDER — TRAZODONE HCL 50 MG PO TABS: 50.0000 mg | ORAL_TABLET | Freq: Every evening | ORAL | 1 refills | Status: AC | PRN

## 2023-08-08 ENCOUNTER — Other Ambulatory Visit: Payer: Self-pay | Admitting: Family Medicine

## 2023-09-20 ENCOUNTER — Ambulatory Visit: Payer: BC Managed Care – PPO | Admitting: Family Medicine

## 2023-09-20 ENCOUNTER — Encounter: Payer: Self-pay | Admitting: Family Medicine

## 2023-09-20 VITALS — BP 100/70 | HR 103 | Temp 98.4°F | Ht 70.0 in | Wt 221.7 lb

## 2023-09-20 DIAGNOSIS — I10 Essential (primary) hypertension: Secondary | ICD-10-CM | POA: Diagnosis not present

## 2023-09-20 DIAGNOSIS — M7581 Other shoulder lesions, right shoulder: Secondary | ICD-10-CM | POA: Diagnosis not present

## 2023-09-20 DIAGNOSIS — G43109 Migraine with aura, not intractable, without status migrainosus: Secondary | ICD-10-CM

## 2023-09-20 DIAGNOSIS — E785 Hyperlipidemia, unspecified: Secondary | ICD-10-CM | POA: Diagnosis not present

## 2023-09-20 DIAGNOSIS — E781 Pure hyperglyceridemia: Secondary | ICD-10-CM | POA: Diagnosis not present

## 2023-09-20 MED ORDER — VENLAFAXINE HCL ER 150 MG PO CP24: 150.0000 mg | ORAL_CAPSULE | Freq: Every day | ORAL | 3 refills | Status: DC

## 2023-09-20 MED ORDER — TELMISARTAN 40 MG PO TABS: 40.0000 mg | ORAL_TABLET | Freq: Every day | ORAL | 3 refills | Status: DC

## 2023-09-20 MED ORDER — TOPIRAMATE 100 MG PO TABS: 100.0000 mg | ORAL_TABLET | Freq: Every day | ORAL | 3 refills | Status: DC

## 2023-09-20 MED ORDER — FENOFIBRATE 160 MG PO TABS: 160.0000 mg | ORAL_TABLET | Freq: Every day | ORAL | 3 refills | Status: DC

## 2023-09-20 MED ORDER — OMEPRAZOLE 40 MG PO CPDR: 40.0000 mg | DELAYED_RELEASE_CAPSULE | Freq: Every day | ORAL | 3 refills | Status: DC

## 2023-09-20 NOTE — Progress Notes (Signed)
Established Patient Office Visit  Subjective   Patient ID: Timothy Foster, male    DOB: 1985-08-21  Age: 37 y.o. MRN: [id number]  Chief Complaint  Patient presents with   Shoulder Pain    Right shoulder pain, rate of pain 6 out of 10, patient states the pain is worse at night     HPI   Timothy Foster is seen with right shoulder pain for the past couple weeks.  Denies any specific injury.  Pain is somewhat poorly localized but really more subacromial region.  Occasionally radiates anterior.  Pain with abduction greater than 90 degrees and lesser so with internal rotation.  Pain seems to be worse at night.  6 out of 10 severity.  Denies any cervical radiculitis features.  No upper extremity numbness or weakness.  Other medical problems include history of hypertension, migraine headaches, GERD, ADD, dyslipidemia, chronic insomnia.  He needs refills of several medications including Topamax, omeprazole, fenofibrate, Micardis.  He is currently on Topamax 75 mg and would like to increase this to 100 mg for simplicity to 1 tablet at night.  This seems to be working well for migraine prevention.  He has history of very high triglycerides and takes fenofibrate.  Is due for follow-up lipid panel.  Nonfasting today.  Past Medical History:  Diagnosis Date   ADD (attention deficit disorder)    Allergy    Anxiety    B12 deficiency    Chicken pox    Depression    GERD (gastroesophageal reflux disease)    Headache(784.0)    Hyperlipidemia    Internal hemorrhoids    Migraine    Past Surgical History:  Procedure Laterality Date   WISDOM TOOTH EXTRACTION      reports that he has never smoked. He has never used smokeless tobacco. He reports current alcohol use. He reports that he does not use drugs. family history includes Alcohol abuse in his maternal uncle and paternal uncle; Cancer in his paternal uncle; Colon cancer in his paternal uncle; Crohn's disease in his maternal aunt; Diabetes in his father  and mother; Esophageal cancer in his father; Pancreatic cancer in his paternal uncle; Stomach cancer in his paternal grandfather; Stroke in his father. Allergies  Allergen Reactions   Penicillins Anaphylaxis    Hives as a child Hives as a child    Review of Systems  Constitutional:  Negative for malaise/fatigue.  Eyes:  Negative for blurred vision.  Respiratory:  Negative for shortness of breath.   Cardiovascular:  Negative for chest pain.  Neurological:  Negative for dizziness, weakness and headaches.      Objective:     BP 100/70 (BP Location: Left Arm, Patient Position: Sitting, Cuff Size: Large)   Pulse (!) 103   Temp 98.4 F (36.9 C) (Oral)   Ht 5\' 10"  (1.778 m)   Wt 221 lb 11.2 oz (100.6 kg)   SpO2 97%   BMI 31.81 kg/m  BP Readings from Last 3 Encounters:  09/20/23 100/70  07/06/23 (!) 130/96  09/01/22 108/74   Wt Readings from Last 3 Encounters:  09/20/23 221 lb 11.2 oz (100.6 kg)  07/06/23 224 lb 3.2 oz (101.7 kg)  09/01/22 221 lb 12.8 oz (100.6 kg)      Physical Exam Vitals reviewed.  Constitutional:      Appearance: He is well-developed.  HENT:     Right Ear: External ear normal.     Left Ear: External ear normal.  Eyes:     Pupils: Pupils  are equal, round, and reactive to light.  Neck:     Thyroid: No thyromegaly.  Cardiovascular:     Rate and Rhythm: Normal rate and regular rhythm.  Pulmonary:     Effort: Pulmonary effort is normal. No respiratory distress.     Breath sounds: Normal breath sounds. No wheezing or rales.  Musculoskeletal:     Cervical back: Neck supple.     Comments: Right shoulder reveals full range of motion.  He does have pain with abduction greater than 90 to 100 degrees against resistance.  Mild pain with internal rotation.  No biceps tenderness.  No acromioclavicular joint tenderness.  Neurological:     Mental Status: He is alert and oriented to person, place, and time.      No results found for any visits on  09/20/23.    The ASCVD Risk score (Arnett DK, et al., 2019) failed to calculate for the following reasons:   The 2019 ASCVD risk score is only valid for ages 86 to 23    Assessment & Plan:   #1 right shoulder pain.  Suspect impingement syndrome/rotator cuff tendinitis.  No evidence for weakness.  Patient would like to consider interventional therapies to try to help with getting this improved.  We discussed option of corticosteroid injection.  Discussed risks and benefits of corticosteroid injection and patient consented.  After prepping skin with betadine, injected 40 mg depomedrol and 2 cc of plain xylocaine with 23 gauge one and one half inch needle using posterior lateral approach and pt tolerated well.  Will try some icing tonight.  Gentle range of motion and be in touch if not improving over the next week or 2  #2 hypertension stable and well-controlled on Micardis.  Refill for 1 year  #3 history of migraine headaches.  Currently stable on Topamax.  Refill Topamax 100 mg nightly  #4 dyslipidemia.  Patient on fenofibrate.  Due for follow-up labs.  Future lab order placed for lipid and CMP.  Refill fenofibrate for 1 year   No follow-ups on file.    Evelena Peat, MD

## 2023-09-27 ENCOUNTER — Encounter: Payer: Self-pay | Admitting: Family Medicine

## 2023-09-28 MED ORDER — VENLAFAXINE HCL ER 150 MG PO CP24: 150.0000 mg | ORAL_CAPSULE | Freq: Every day | ORAL | 0 refills | Status: DC

## 2023-09-28 NOTE — Addendum Note (Signed)
 Addended by: Philipp Deputy A on: 09/28/2023 10:52 AM   Modules accepted: Orders

## 2023-10-06 ENCOUNTER — Other Ambulatory Visit: Payer: Self-pay | Admitting: Family Medicine

## 2023-10-06 MED ORDER — METHYLPREDNISOLONE ACETATE 40 MG/ML IJ SUSP
40.0000 mg | Freq: Once | INTRAMUSCULAR | Status: AC
  Administered 2023-09-20: 40 mg via INTRAMUSCULAR

## 2023-10-06 NOTE — Addendum Note (Signed)
 Addended by: Kristian Covey on: 10/06/2023 11:17 AM   Modules accepted: Orders

## 2023-10-07 NOTE — Progress Notes (Signed)
 okay

## 2023-10-26 ENCOUNTER — Other Ambulatory Visit: Payer: Self-pay | Admitting: Family Medicine

## 2023-10-31 ENCOUNTER — Other Ambulatory Visit: Payer: Self-pay | Admitting: Family Medicine

## 2023-11-23 ENCOUNTER — Ambulatory Visit (INDEPENDENT_AMBULATORY_CARE_PROVIDER_SITE_OTHER): Admitting: Family Medicine

## 2023-11-23 ENCOUNTER — Encounter: Payer: Self-pay | Admitting: Family Medicine

## 2023-11-23 VITALS — BP 108/80 | HR 97 | Temp 97.9°F | Wt 220.3 lb

## 2023-11-23 DIAGNOSIS — K921 Melena: Secondary | ICD-10-CM | POA: Diagnosis not present

## 2023-11-23 DIAGNOSIS — I10 Essential (primary) hypertension: Secondary | ICD-10-CM | POA: Diagnosis not present

## 2023-11-23 DIAGNOSIS — E785 Hyperlipidemia, unspecified: Secondary | ICD-10-CM | POA: Diagnosis not present

## 2023-11-23 DIAGNOSIS — E781 Pure hyperglyceridemia: Secondary | ICD-10-CM

## 2023-11-23 NOTE — Progress Notes (Signed)
 Established Patient Office Visit  Subjective   Patient ID: Kijana Cromie, male    DOB: 1986/05/20  Age: 38 y.o. MRN: [id number]  Chief Complaint  Patient presents with   Referral    HPI   Cleave has a history of hypertension, migraine headaches, GERD, ADD, dyslipidemia.  He is here today to discuss several things as follows  Brings in FMLA forms.  He works for an [employer] and has history of migraine headaches.  He is doing better on Topamax  and also takes Effexor  which has been used for migraine prophylaxis.  Still has occasional breakthrough migraines.  We have completed these forms previously for him.  He uses Imitrex  as needed for acute relief.  Usually works well if he catches this early enough.  He relates at least couple of months of some intermittent bright red blood per rectum.  Had similar symptoms back in 2015 and had colonoscopy then which showed some small internal hemorrhoids but no other worrisome findings.  He has not had any recent appetite or weight changes.  Had an uncle with colon cancer.  Paternal grand father with gastric cancer in his father had esophageal cancer.  Denies any pain with stools.  No recent change in stool character other than the bleeding which is bright red.  He has had some recent fatigue and wonders if this may be related.  He specifically would like to see GI again for consideration of repeat colonoscopy  Does have history of hyperlipidemia treated with fenofibrate .  Very high triglycerides previously.  Blood pressure controlled with Micardis  40 mg daily.  Overdue for lipids  Past Medical History:  Diagnosis Date   ADD (attention deficit disorder)    Allergy    Anxiety    B12 deficiency    Chicken pox    Depression    GERD (gastroesophageal reflux disease)    Headache(784.0)    Hyperlipidemia    Internal hemorrhoids    Migraine    Past Surgical History:  Procedure Laterality Date   WISDOM TOOTH EXTRACTION      reports that he has never  smoked. He has never used smokeless tobacco. He reports current alcohol use. He reports that he does not use drugs. family history includes Alcohol abuse in his maternal uncle and paternal uncle; Cancer in his paternal uncle; Colon cancer in his paternal uncle; Crohn's disease in his maternal aunt; Diabetes in his father and mother; Esophageal cancer in his father; Pancreatic cancer in his paternal uncle; Stomach cancer in his paternal grandfather; Stroke in his father. Allergies  Allergen Reactions   Penicillins Anaphylaxis    Hives as a child Hives as a child    Review of Systems  Constitutional:  Negative for malaise/fatigue.  Eyes:  Negative for blurred vision.  Respiratory:  Negative for shortness of breath.   Cardiovascular:  Negative for chest pain.  Neurological:  Negative for dizziness and weakness.      Objective:     BP 108/80 (BP Location: Left Arm, Patient Position: Sitting, Cuff Size: Large)   Pulse 97   Temp 97.9 F (36.6 C) (Oral)   Wt 220 lb 4.8 oz (99.9 kg)   SpO2 98%   BMI 31.61 kg/m  BP Readings from Last 3 Encounters:  11/23/23 108/80  09/20/23 100/70  07/06/23 (!) 130/96   Wt Readings from Last 3 Encounters:  11/23/23 220 lb 4.8 oz (99.9 kg)  09/20/23 221 lb 11.2 oz (100.6 kg)  07/06/23 224 lb 3.2 oz (101.7  kg)      Physical Exam Vitals reviewed.  Constitutional:      Appearance: He is well-developed.  Eyes:     Pupils: Pupils are equal, round, and reactive to light.  Neck:     Thyroid : No thyromegaly.  Cardiovascular:     Rate and Rhythm: Normal rate and regular rhythm.  Pulmonary:     Effort: Pulmonary effort is normal. No respiratory distress.     Breath sounds: Normal breath sounds. No wheezing or rales.  Musculoskeletal:     Cervical back: Neck supple.  Neurological:     Mental Status: He is alert and oriented to person, place, and time.      No results found for any visits on 11/23/23.    The ASCVD Risk score (Arnett DK, et  al., 2019) failed to calculate for the following reasons:   The 2019 ASCVD risk score is only valid for ages 10 to 40    Assessment & Plan:   #1 migraine headaches.  Currently stable on prophylactic medication with Topamax  and also takes Effexor .  He uses Imitrex  for breakthrough headaches.  Requesting FMLA papers to be completed.  #2 intermittent bright red blood per rectum.  Previous colonoscopy 10 years ago showed some internal hemorrhoids.  Patient requesting referral back to GI.  Will check CBC to verify no anemia.  He has not had any red flags such as appetite change or weight loss.  Suspect bleeding probably related to internal hemorrhoids  #3 dyslipidemia.  Patient on fenofibrate .  Future lab order placed for lipid and CMP  #4 hypertension stable and at goal on Micardis  40 mg daily     No follow-ups on file.    Glean Lamy, MD

## 2023-11-25 ENCOUNTER — Other Ambulatory Visit (INDEPENDENT_AMBULATORY_CARE_PROVIDER_SITE_OTHER)

## 2023-11-25 DIAGNOSIS — E785 Hyperlipidemia, unspecified: Secondary | ICD-10-CM

## 2023-11-25 DIAGNOSIS — K921 Melena: Secondary | ICD-10-CM

## 2023-11-25 DIAGNOSIS — E781 Pure hyperglyceridemia: Secondary | ICD-10-CM | POA: Diagnosis not present

## 2023-11-25 LAB — COMPREHENSIVE METABOLIC PANEL WITH GFR
ALT: 39 U/L (ref 0–53)
AST: 26 U/L (ref 0–37)
Albumin: 4.7 g/dL (ref 3.5–5.2)
Alkaline Phosphatase: 53 U/L (ref 39–117)
BUN: 16 mg/dL (ref 6–23)
CO2: 25 meq/L (ref 19–32)
Calcium: 9.4 mg/dL (ref 8.4–10.5)
Chloride: 106 meq/L (ref 96–112)
Creatinine, Ser: 1.44 mg/dL (ref 0.40–1.50)
GFR: 61.31 mL/min (ref >60.00)
Glucose, Bld: 94 mg/dL (ref 70–99)
Potassium: 3.7 meq/L (ref 3.5–5.1)
Sodium: 140 meq/L (ref 135–145)
Total Bilirubin: 0.6 mg/dL (ref 0.2–1.2)
Total Protein: 7.3 g/dL (ref 6.0–8.3)

## 2023-11-25 LAB — CBC WITH DIFFERENTIAL/PLATELET
Basophils Absolute: 0.1 10*3/uL (ref 0.0–0.1)
Basophils Relative: 1.2 % (ref 0.0–3.0)
Eosinophils Absolute: 0.4 10*3/uL (ref 0.0–0.7)
Eosinophils Relative: 6.2 % — ABNORMAL HIGH (ref 0.0–5.0)
HCT: 43.7 % (ref 39.0–52.0)
Hemoglobin: 15.4 g/dL (ref 13.0–17.0)
Lymphocytes Relative: 39.1 % (ref 12.0–46.0)
Lymphs Abs: 2.5 10*3/uL (ref 0.7–4.0)
MCHC: 35.2 g/dL (ref 30.0–36.0)
MCV: 93.5 fl (ref 78.0–100.0)
Monocytes Absolute: 0.8 10*3/uL (ref 0.1–1.0)
Monocytes Relative: 11.9 % (ref 3.0–12.0)
Neutro Abs: 2.6 10*3/uL (ref 1.4–7.7)
Neutrophils Relative %: 41.6 % — ABNORMAL LOW (ref 43.0–77.0)
Platelets: 241 10*3/uL (ref 150.0–400.0)
RBC: 4.67 Mil/uL (ref 4.22–5.81)
RDW: 12.9 % (ref 11.5–15.5)
WBC: 6.3 10*3/uL (ref 4.0–10.5)

## 2023-11-25 LAB — LIPID PANEL
Cholesterol: 207 mg/dL — ABNORMAL HIGH (ref 0–200)
HDL: 24.1 mg/dL — ABNORMAL LOW (ref >39.00)
LDL Cholesterol: 104 mg/dL — ABNORMAL HIGH (ref 0–99)
NonHDL: 182.83
Total CHOL/HDL Ratio: 9
Triglycerides: 393 mg/dL — ABNORMAL HIGH (ref 0.0–149.0)
VLDL: 78.6 mg/dL — ABNORMAL HIGH (ref 0.0–40.0)

## 2024-01-31 NOTE — Progress Notes (Addendum)
 02/01/2024 [place] [id number] 1985-09-20  Referring provider: Micheal Wolm ORN, MD Primary GI doctor: Dr. Suzann ( Dr. Aneita)  ASSESSMENT AND PLAN:  Rectal bleeding with history of Internal hemorrhoids Last several months some AB discomfort and worsening rectal bleeding and change in bowel habits 08/07/2013 Colonoscopy, good prep, normal exam, small internal hemorrhoids, neg pathology repeat age 38.  Treated with hydrocortisone  10/20/2021 Labs reviewed from 11/25/2023 CBC without anemia Likely hemorrhoids but feels this is different, will repeat colonoscopy while still treating for hemorrhoids - colonoscopy at [facility] with EGD, Risk of bowel prep, conscious sedation, and EGD and colonoscopy were discussed.  Risks include but are not limited to dehydration, pain, bleeding, cardiopulmonary process, bowel perforation, or other possible adverse outcomes.. Treatment plan was discussed with patient, and agreed upon. -Sitz baths, increase fiber, increase water -Hydrocortisone  supp given and external cream sent in.  -We discussed hemorrhoid banding here in the office for internal hemorrhoids if not improving with conservative treatment. - follow up for evaluation here in the office.   GERD Rare ETOH, no tobacco use, rare NSAIDS Having worsening GERD despite prilosec 40 mg daily, waking him at night, into his throat.  -Lifestyle changes discussed, avoid NSAIDS, ETOH, hand out given to the patient -Weight loss discussed with the patient -Schedule EGD at [facility] to evaluate GERD, esophagitis, hiatal hernia,H pylori. I discussed risks of EGD with patient today, including risk of sedation, bleeding or perforation. Patient provides understanding and gave verbal consent to proceed. - consider RUQ US  and HIDA pending results  Colon cancer screening Paternal Uncle with colon CA, paternal uncle with pancreatic CA, paternal GF with stomach CA, father with esophageal CA 08/07/2013 Colonoscopy, good  prep, normal exam, small internal hemorrhoids, neg pathology repeat age 54.   B12 deficiency 11/25/2023  HGB 15.4 MCV 93.5 Platelets 241.0 Continue supplementation Check antiparietal and intrinsic factor Will proceed with EGD  I have reviewed the clinic note as outlined by Alan Coombs, PA and agree with the assessment, plan and medical decision making.  Mr. Donaway presents to the office for evaluation of rectal bleeding and GERD.  Had a colonoscopy in 2015 that disclosed hemorrhoids.  He feels his symptoms may be different at this time and will therefore proceed with investigation via colonoscopy.  Also having symptoms of GERD refractory to PPI.  EGD to be performed at the same time as colonoscopy.  Inocente Suzann, MD   Patient Care Team: Micheal Wolm ORN, MD as PCP - General (Family Medicine) Lonni Slain, MD as PCP - Cardiology (Cardiology)  HISTORY OF PRESENT ILLNESS: 38 y.o. male with a past medical history listed below presents for evaluation of rectal bleeding and GERD.   Patient last seen in the office 10/20/2021 by myself for internal hemorrhoids treated conservatively.  Normal colonoscopy 2015.  Discussed the use of AI scribe software for clinical note transcription with the patient, who gave verbal consent to proceed.  History of Present Illness   Reedy Biernat is a 38 year old male with internal hemorrhoids who presents with worsening rectal bleeding and abdominal discomfort.  He has experienced worsening rectal bleeding over the past few months, with episodes where the toilet bowl is filled with blood, a change from previous experiences where bleeding was only noticed upon wiping. No external hemorrhoids have been observed during these episodes. He has a history of internal hemorrhoids and underwent a colonoscopy in January 2015. Recently, he has experienced abdominal discomfort accompanying the bleeding, which is a new  symptom for him.  He has a family history  of various cancers on his paternal side, including colon cancer in an uncle, esophageal cancer in his father, pancreatic cancer in another uncle, and stomach cancer in his grandfather. This family history was a factor in his previous colonoscopy.  He has experienced episodes of waking up due to stomach acid reaching his throat, a new occurrence for him. He has a history of reflux but notes that this symptom is new and concerning. He takes Prilosec for reflux and has recently started taking B12 supplements after discovering a deficiency, which he attributes to Prilosec use. He also takes magnesium occasionally for 'twitchy legs'.  No dark black stools, nausea, vomiting, or significant weight changes. He consumes alcohol infrequently and does not smoke. He occasionally takes migraine medication and rarely uses Aleve or Advil.      He  reports that he has never smoked. He has never used smokeless tobacco. He reports current alcohol use. He reports that he does not use drugs.  RELEVANT GI HISTORY, IMAGING AND LABS: Results   LABS Vitamin B12: low      CBC    Component Value Date/Time   WBC 6.3 11/25/2023 0909   RBC 4.67 11/25/2023 0909   HGB 15.4 11/25/2023 0909   HCT 43.7 11/25/2023 0909   PLT 241.0 11/25/2023 0909   MCV 93.5 11/25/2023 0909   MCHC 35.2 11/25/2023 0909   RDW 12.9 11/25/2023 0909   LYMPHSABS 2.5 11/25/2023 0909   MONOABS 0.8 11/25/2023 0909   EOSABS 0.4 11/25/2023 0909   BASOSABS 0.1 11/25/2023 0909   Recent Labs    11/25/23 0909  HGB 15.4    CMP     Component Value Date/Time   NA 140 11/25/2023 0909   NA 142 06/01/2019 1149   K 3.7 11/25/2023 0909   CL 106 11/25/2023 0909   CO2 25 11/25/2023 0909   GLUCOSE 94 11/25/2023 0909   BUN 16 11/25/2023 0909   BUN 10 06/01/2019 1149   CREATININE 1.44 11/25/2023 0909   CALCIUM 9.4 11/25/2023 0909   PROT 7.3 11/25/2023 0909   ALBUMIN 4.7 11/25/2023 0909   AST 26 11/25/2023 0909   ALT 39 11/25/2023 0909    ALKPHOS 53 11/25/2023 0909   BILITOT 0.6 11/25/2023 0909   GFRNONAA 86 06/01/2019 1149   GFRAA 100 06/01/2019 1149      Latest Ref Rng & Units 11/25/2023    9:09 AM 09/01/2022   10:00 AM 02/03/2021    8:27 AM  Hepatic Function  Total Protein 6.0 - 8.3 g/dL 7.3  7.3  7.4   Albumin 3.5 - 5.2 g/dL 4.7  4.8  5.0   AST 0 - 37 U/L 26  22  31    ALT 0 - 53 U/L 39  44  68   Alk Phosphatase 39 - 117 U/L 53  66  89   Total Bilirubin 0.2 - 1.2 mg/dL 0.6  0.4  0.5   Bilirubin, Direct 0.0 - 0.3 mg/dL  0.0  0.1       Current Medications:    Current Outpatient Medications (Cardiovascular):    fenofibrate  160 MG tablet, Take 1 tablet (160 mg total) by mouth daily.   tadalafil  (CIALIS ) 10 MG tablet, Take 1 tablet by mouth every other day as needed for erectile dysfunction. **DO NOT MIX WITH NITROGLYCERIN *   telmisartan  (MICARDIS ) 40 MG tablet, Take 1 tablet (40 mg total) by mouth daily.  Current Outpatient Medications (Respiratory):  fexofenadine  (ALLEGRA ) 180 MG tablet, Take 1 tablet (180 mg total) by mouth daily.  Current Outpatient Medications (Analgesics):    SUMAtriptan  (IMITREX ) 100 MG tablet, 1 TAB AT ONSET OF MIGRAINE MAY REPEAT 1 IN 2 HRS AS NEEDED FOR PERSISTENT HEADACHE MAX 2/24 HRS   Current Outpatient Medications (Other):    hydrocortisone  (ANUSOL -HC) 2.5 % rectal cream, Place 1 Application rectally 2 (two) times daily.   hydrocortisone  (ANUSOL -HC) 25 MG suppository, Place 1 suppository (25 mg total) rectally 2 (two) times daily.   omeprazole  (PRILOSEC) 40 MG capsule, Take 1 capsule (40 mg total) by mouth daily.   topiramate  (TOPAMAX ) 100 MG tablet, Take 1 tablet (100 mg total) by mouth daily.   traZODone  (DESYREL ) 50 MG tablet, Take 1 tablet (50 mg total) by mouth at bedtime as needed. for sleep   venlafaxine  XR (EFFEXOR -XR) 150 MG 24 hr capsule, TAKE 1 CAPSULE BY MOUTH EVERY DAY  Medical History:  Past Medical History:  Diagnosis Date   ADD (attention deficit disorder)     Allergy    Anxiety    B12 deficiency    Chicken pox    Depression    GERD (gastroesophageal reflux disease)    Headache(784.0)    Hyperlipidemia    Internal hemorrhoids    Migraine    Allergies:  Allergies  Allergen Reactions   Penicillins Anaphylaxis    Hives as a child Hives as a child     Surgical History:  He  has a past surgical history that includes Wisdom tooth extraction. Family History:  His family history includes Alcohol abuse in his maternal uncle and paternal uncle; Cancer in his paternal uncle; Colon cancer in his paternal uncle; Crohn's disease in his maternal aunt; Diabetes in his father and mother; Esophageal cancer in his father; Pancreatic cancer in his paternal uncle; Stomach cancer in his paternal grandfather; Stroke in his father.  REVIEW OF SYSTEMS  : All other systems reviewed and negative except where noted in the History of Present Illness.  PHYSICAL EXAM: BP 130/80   Pulse 76   Ht 5' 10 (1.778 m)   Wt 225 lb (102.1 kg)   BMI 32.28 kg/m  Physical Exam   GENERAL APPEARANCE: Well nourished, in no apparent distress. HEENT: No cervical lymphadenopathy, unremarkable thyroid , sclerae anicteric, conjunctiva pink. RESPIRATORY: Respiratory effort normal, breath sounds equal bilaterally without rales, rhonchi, or wheezing. CARDIO: Regular rate and rhythm with no murmurs, rubs, or gallops, peripheral pulses intact. ABDOMEN: Soft, non-distended, active bowel sounds in all four quadrants, no tenderness to palpation, no rebound, no mass appreciated. RECTAL: Right posterior hemorrhoid present, no anal fissures, no masses felt. MUSCULOSKELETAL: Full range of motion, normal gait, without edema. SKIN: Dry, intact without rashes or lesions. No jaundice. NEURO: Alert, oriented, no focal deficits. PSYCH: Cooperative, normal mood and affect.      Alan JONELLE Coombs, PA-C 10:36 AM

## 2024-02-01 ENCOUNTER — Ambulatory Visit: Admitting: Physician Assistant

## 2024-02-01 ENCOUNTER — Encounter: Payer: Self-pay | Admitting: Physician Assistant

## 2024-02-01 ENCOUNTER — Other Ambulatory Visit (INDEPENDENT_AMBULATORY_CARE_PROVIDER_SITE_OTHER)

## 2024-02-01 VITALS — BP 130/80 | HR 76 | Ht 70.0 in | Wt 225.0 lb

## 2024-02-01 DIAGNOSIS — E538 Deficiency of other specified B group vitamins: Secondary | ICD-10-CM

## 2024-02-01 DIAGNOSIS — R194 Change in bowel habit: Secondary | ICD-10-CM

## 2024-02-01 DIAGNOSIS — K219 Gastro-esophageal reflux disease without esophagitis: Secondary | ICD-10-CM

## 2024-02-01 DIAGNOSIS — K625 Hemorrhage of anus and rectum: Secondary | ICD-10-CM | POA: Diagnosis not present

## 2024-02-01 DIAGNOSIS — K649 Unspecified hemorrhoids: Secondary | ICD-10-CM

## 2024-02-01 DIAGNOSIS — Z800 Family history of malignant neoplasm of digestive organs: Secondary | ICD-10-CM

## 2024-02-01 LAB — CBC WITH DIFFERENTIAL/PLATELET
Basophils Absolute: 0.1 10*3/uL (ref 0.0–0.1)
Basophils Relative: 1.4 % (ref 0.0–3.0)
Eosinophils Absolute: 0.3 10*3/uL (ref 0.0–0.7)
Eosinophils Relative: 6 % — ABNORMAL HIGH (ref 0.0–5.0)
HCT: 40.9 % (ref 39.0–52.0)
Hemoglobin: 14.3 g/dL (ref 13.0–17.0)
Lymphocytes Relative: 40.2 % (ref 12.0–46.0)
Lymphs Abs: 2 10*3/uL (ref 0.7–4.0)
MCHC: 35 g/dL (ref 30.0–36.0)
MCV: 91.6 fl (ref 78.0–100.0)
Monocytes Absolute: 0.6 10*3/uL (ref 0.1–1.0)
Monocytes Relative: 11 % (ref 3.0–12.0)
Neutro Abs: 2.1 10*3/uL (ref 1.4–7.7)
Neutrophils Relative %: 41.4 % — ABNORMAL LOW (ref 43.0–77.0)
Platelets: 253 10*3/uL (ref 150.0–400.0)
RBC: 4.47 Mil/uL (ref 4.22–5.81)
RDW: 12.7 % (ref 11.5–15.5)
WBC: 5 10*3/uL (ref 4.0–10.5)

## 2024-02-01 LAB — COMPREHENSIVE METABOLIC PANEL WITH GFR
ALT: 29 U/L (ref 0–53)
AST: 18 U/L (ref 0–37)
Albumin: 4.8 g/dL (ref 3.5–5.2)
Alkaline Phosphatase: 56 U/L (ref 39–117)
BUN: 17 mg/dL (ref 6–23)
CO2: 23 meq/L (ref 19–32)
Calcium: 9.9 mg/dL (ref 8.4–10.5)
Chloride: 109 meq/L (ref 96–112)
Creatinine, Ser: 1.47 mg/dL (ref 0.40–1.50)
GFR: 59.73 mL/min — ABNORMAL LOW (ref >60.00)
Glucose, Bld: 106 mg/dL — ABNORMAL HIGH (ref 70–99)
Potassium: 3.8 meq/L (ref 3.5–5.1)
Sodium: 140 meq/L (ref 135–145)
Total Bilirubin: 0.6 mg/dL (ref 0.2–1.2)
Total Protein: 7.3 g/dL (ref 6.0–8.3)

## 2024-02-01 MED ORDER — NA SULFATE-K SULFATE-MG SULF 17.5-3.13-1.6 GM/177ML PO SOLN: 1.0000 | Freq: Once | ORAL | 0 refills | Status: AC

## 2024-02-01 MED ORDER — HYDROCORTISONE ACETATE 25 MG RE SUPP: 25.0000 mg | Freq: Two times a day (BID) | RECTAL | 0 refills | Status: DC

## 2024-02-01 MED ORDER — HYDROCORTISONE (PERIANAL) 2.5 % EX CREA: 1.0000 | TOPICAL_CREAM | Freq: Two times a day (BID) | CUTANEOUS | 2 refills | Status: AC

## 2024-02-01 NOTE — Patient Instructions (Addendum)
 Your provider has requested that you go to the basement level for lab work before leaving today. Press B on the elevator. The lab is located at the first door on the left as you exit the elevator.  Due to recent changes in healthcare laws, you may see the results of your imaging and laboratory studies on MyChart before your provider has had a chance to review them.  We understand that in some cases there may be results that are confusing or concerning to you. Not all laboratory results come back in the same time frame and the provider may be waiting for multiple results in order to interpret others.  Please give us  48 hours in order for your provider to thoroughly review all the results before contacting the office for clarification of your results.    Please take your proton pump inhibitor medication, prilosec, add pepcid on dose Please take this medication 30 minutes to 1 hour before meals- this makes it more effective.  Avoid spicy and acidic foods Avoid fatty foods Limit your intake of coffee, tea, alcohol, and carbonated drinks Work to maintain a healthy weight Keep the head of the bed elevated at least 3 inches with blocks or a wedge pillow if you are having any nighttime symptoms Stay upright for 2 hours after eating Avoid meals and snacks three to four hours before bedtime  Please do sitz baths- these can be found at the pharmacy. It is a Chief Operating Officer that is put in your toliet.  Please increase fiber or add benefiber, increase water and increase acitivity.  Will send in hydrocoritsone suppository, cheapest with GOODRX from [employer], [employer], [employer] or [facility] if your insurance does not pay for it. If the hemorrhoid suppository sent in is too expensive you can do this over the counter trick.  Apply a pea size amount of generic prescription Anusol  HC cream that has been sent into your pharmacy to the tip of an over the counter PrepH suppository and insert rectally once every night  for at least 7 nights.  If this does not improve there are procedures that can be done.    You have been scheduled for an endoscopy and colonoscopy. Please follow the written instructions given to you at your visit today.  If you use inhalers (even only as needed), please bring them with you on the day of your procedure.  DO NOT TAKE 7 DAYS PRIOR TO TEST- Trulicity (dulaglutide) Ozempic, Wegovy (semaglutide) Mounjaro (tirzepatide) Bydureon Bcise (exanatide extended release)  DO NOT TAKE 1 DAY PRIOR TO YOUR TEST Rybelsus (semaglutide) Adlyxin (lixisenatide) Victoza (liraglutide) Byetta (exanatide) ___________________________________________________________________________   Thank you for trusting me with your gastrointestinal care!   Alan Coombs, PA-C    About Hemorrhoids  Hemorrhoids are swollen veins in the lower rectum and anus.  Also called piles, hemorrhoids are a common problem.  Hemorrhoids may be internal (inside the rectum) or external (around the anus).  Internal Hemorrhoids  Internal hemorrhoids are often painless, but they rarely cause bleeding.  The internal veins may stretch and fall down (prolapse) through the anus to the outside of the body.  The veins may then become irritated and painful.  External Hemorrhoids  External hemorrhoids can be easily seen or felt around the anal opening.  They are under the skin around the anus.  When the swollen veins are scratched or broken by straining, rubbing or wiping they sometimes bleed.  How Hemorrhoids Occur  Veins in the rectum and around the anus  tend to swell under pressure.  Hemorrhoids can result from increased pressure in the veins of your anus or rectum.  Some sources of pressure are:  Straining to have a bowel movement because of constipation Waiting too long to have a bowel movement Coughing and sneezing often Sitting for extended periods of time, including on the toilet Diarrhea Obesity Trauma or  injury to the anus Some liver diseases Stress Family history of hemorrhoids Pregnancy  Pregnant women should try to avoid becoming constipated, because they are more likely to have hemorrhoids during pregnancy.  In the last trimester of pregnancy, the enlarged uterus may press on blood vessels and causes hemorrhoids.  In addition, the strain of childbirth sometimes causes hemorrhoids after the birth.  Symptoms of Hemorrhoids  Some symptoms of hemorrhoids include: Swelling and/or a tender lump around the anus Itching, mild burning and bleeding around the anus Painful bowel movements with or without constipation Bright red blood covering the stool, on toilet paper or in the toilet bowel.   Symptoms usually go away within a few days.  Always talk to your doctor about any bleeding to make sure it is not from some other causes.  Diagnosing and Treating Hemorrhoids  Diagnosis is made by an examination by your healthcare provider.  Special test can be performed by your doctor.    Most cases of hemorrhoids can be treated with: High-fiber diet: Eat more high-fiber foods, which help prevent constipation.  Ask for more detailed fiber information on types and sources of fiber from your healthcare provider. Fluids: Drink plenty of water.  This helps soften bowel movements so they are easier to pass. Sitz baths and cold packs: Sitting in lukewarm water two or three times a day for 15 minutes cleases the anal area and may relieve discomfort.  If the water is too hot, swelling around the anus will get worse.  Placing a cloth-covered ice pack on the anus for ten minutes four times a day can also help reduce selling.  Gently pushing a prolapsed hemorrhoid back inside after the bath or ice pack can be helpful. Medications: For mild discomfort, your healthcare provider may suggest over-the-counter pain medication or prescribe a cream or ointment for topical use.  The cream may contain witch hazel, zinc oxide or  petroleum jelly.  Medicated suppositories are also a treatment option.  Always consult your doctor before applying medications or creams. Procedures and surgeries: There are also a number of procedures and surgeries to shrink or remove hemorrhoids in more serious cases.  Talk to your physician about these options.  You can often prevent hemorrhoids or keep them from becoming worse by maintaining a healthy lifestyle.  Eat a fiber-rich diet of fruits, vegetables and whole grains.  Also, drink plenty of water and exercise regularly.   2007, [employer] Doc.30  _______________________________________________________  If your blood pressure at your visit was 140/90 or greater, please contact your primary care physician to follow up on this.  _______________________________________________________  If you are age 90 or older, your body mass index should be between 23-30. Your Body mass index is 32.28 kg/m. If this is out of the aforementioned range listed, please consider follow up with your Primary Care Provider.  If you are age 15 or younger, your body mass index should be between 19-25. Your Body mass index is 32.28 kg/m. If this is out of the aformentioned range listed, please consider follow up with your Primary Care Provider.   ________________________________________________________  The  GI  providers would like to encourage you to use MYCHART to communicate with providers for non-urgent requests or questions.  Due to long hold times on the telephone, sending your provider a message by [facility] may be a faster and more efficient way to get a response.  Please allow 48 business hours for a response.  Please remember that this is for non-urgent requests.  _______________________________________________________

## 2024-02-01 NOTE — Addendum Note (Signed)
 Addended by: WILLIEMAE JOLA PARAS on: 02/01/2024 11:44 AM   Modules accepted: Orders

## 2024-02-02 ENCOUNTER — Ambulatory Visit: Payer: Self-pay | Admitting: Physician Assistant

## 2024-02-09 LAB — INTRINSIC FACTOR ANTIBODIES: Intrinsic Factor: NEGATIVE

## 2024-02-09 LAB — ANTI-PARIETAL ANTIBODY: PARIETAL CELL AB SCREEN: NEGATIVE

## 2024-02-29 ENCOUNTER — Encounter: Payer: Self-pay | Admitting: Pediatrics

## 2024-03-06 NOTE — Progress Notes (Unsigned)
 [facility] History and Physical   Primary Care Physician:  Micheal Wolm ORN, MD   Reason for Procedure:  GERD, rectal bleeding  Plan:    Upper endoscopy and colonoscopy     HPI: Timothy Foster is a 38 y.o. male undergoing upper endoscopy and colonoscopy for investigation of GERD and rectal bleeding.  Patient reports worsening symptoms of GERD despite Prilosec 40 mg orally daily.  Describes symptoms of GERD waking him up at night as well as pyrosis.  No dysphagia.  He has a previous history of rectal bleeding attributed to hemorrhoids.  Reports having a colonoscopy in 2015 that was normal.  Feels that his current rectal bleeding is out of proportion to previous bleeding he had 10 years ago.  Patient reports a family history of a paternal uncle with colorectal cancer but no first-degree relatives.   Past Medical History:  Diagnosis Date   ADD (attention deficit disorder)    Allergy    Anxiety    B12 deficiency    Chicken pox    Depression    GERD (gastroesophageal reflux disease)    Headache(784.0)    Hyperlipidemia    Internal hemorrhoids    Migraine     Past Surgical History:  Procedure Laterality Date   WISDOM TOOTH EXTRACTION      Prior to Admission medications   Medication Sig Start Date End Date Taking? Authorizing Provider  fenofibrate  160 MG tablet Take 1 tablet (160 mg total) by mouth daily. 09/20/23   Burchette, Wolm ORN, MD  fexofenadine  (ALLEGRA ) 180 MG tablet Take 1 tablet (180 mg total) by mouth daily. 03/10/16   Burchette, Wolm ORN, MD  hydrocortisone  (ANUSOL -HC) 2.5 % rectal cream Place 1 Application rectally 2 (two) times daily. 02/01/24   Craig Alan SAUNDERS, PA-C  hydrocortisone  (ANUSOL -HC) 25 MG suppository Place 1 suppository (25 mg total) rectally 2 (two) times daily. 02/01/24   Craig Alan SAUNDERS, PA-C  omeprazole  (PRILOSEC) 40 MG capsule Take 1 capsule (40 mg total) by mouth daily. 09/20/23   Burchette, Wolm ORN, MD  SUMAtriptan  (IMITREX ) 100 MG  tablet 1 TAB AT ONSET OF MIGRAINE MAY REPEAT 1 IN 2 HRS AS NEEDED FOR PERSISTENT HEADACHE MAX 2/24 HRS 10/31/23   Burchette, Wolm ORN, MD  tadalafil  (CIALIS ) 10 MG tablet Take 1 tablet by mouth every other day as needed for erectile dysfunction. **DO NOT MIX WITH NITROGLYCERIN * 06/04/22   Burchette, Wolm ORN, MD  telmisartan  (MICARDIS ) 40 MG tablet Take 1 tablet (40 mg total) by mouth daily. 09/20/23   Burchette, Wolm ORN, MD  topiramate  (TOPAMAX ) 100 MG tablet Take 1 tablet (100 mg total) by mouth daily. 09/20/23   Burchette, Wolm ORN, MD  traZODone  (DESYREL ) 50 MG tablet Take 1 tablet (50 mg total) by mouth at bedtime as needed. for sleep 08/02/23   Micheal Wolm ORN, MD  venlafaxine  XR (EFFEXOR -XR) 150 MG 24 hr capsule TAKE 1 CAPSULE BY MOUTH EVERY DAY 10/26/23   Burchette, Wolm ORN, MD    Current Outpatient Medications  Medication Sig Dispense Refill   fenofibrate  160 MG tablet Take 1 tablet (160 mg total) by mouth daily. 90 tablet 3   fexofenadine  (ALLEGRA ) 180 MG tablet Take 1 tablet (180 mg total) by mouth daily. 30 tablet 1   hydrocortisone  (ANUSOL -HC) 2.5 % rectal cream Place 1 Application rectally 2 (two) times daily. 30 g 2   omeprazole  (PRILOSEC) 40 MG capsule Take 1 capsule (40 mg total) by mouth daily. 90 capsule 3   telmisartan  (  MICARDIS ) 40 MG tablet Take 1 tablet (40 mg total) by mouth daily. 90 tablet 3   topiramate  (TOPAMAX ) 100 MG tablet Take 1 tablet (100 mg total) by mouth daily. 90 tablet 3   venlafaxine  XR (EFFEXOR -XR) 150 MG 24 hr capsule TAKE 1 CAPSULE BY MOUTH EVERY DAY 90 capsule 1   hydrocortisone  (ANUSOL -HC) 25 MG suppository Place 1 suppository (25 mg total) rectally 2 (two) times daily. (Patient not taking: Reported on 03/08/2024) 12 suppository 0   SUMAtriptan  (IMITREX ) 100 MG tablet 1 TAB AT ONSET OF MIGRAINE MAY REPEAT 1 IN 2 HRS AS NEEDED FOR PERSISTENT HEADACHE MAX 2/24 HRS 9 tablet 1   tadalafil  (CIALIS ) 10 MG tablet Take 1 tablet by mouth every other day as needed for  erectile dysfunction. **DO NOT MIX WITH NITROGLYCERIN * 10 tablet 1   traZODone  (DESYREL ) 50 MG tablet Take 1 tablet (50 mg total) by mouth at bedtime as needed. for sleep 90 tablet 1   Current Facility-Administered Medications  Medication Dose Route Frequency Provider Last Rate Last Admin   0.9 %  sodium chloride  infusion  500 mL Intravenous Once Suzann Inocente HERO, MD        Allergies as of 03/08/2024 - Review Complete 03/08/2024  Allergen Reaction Noted   Penicillins Anaphylaxis 06/01/2011    Family History  Problem Relation Age of Onset   Diabetes Mother    Stroke Father    Diabetes Father    Esophageal cancer Father    Alcohol abuse Maternal Uncle    Alcohol abuse Paternal Uncle    Cancer Paternal Uncle    Pancreatic cancer Paternal Uncle    Colon cancer Paternal Uncle    Crohn's disease Maternal Aunt    Stomach cancer Paternal Grandfather     Social History   Socioeconomic History   Marital status: Married    Spouse name: Not on file   Number of children: Not on file   Years of education: Not on file   Highest education level: 12th grade  Occupational History   Occupation: Dentist: [employer]  Tobacco Use   Smoking status: Never   Smokeless tobacco: Never  Vaping Use   Vaping status: Never Used  Substance and Sexual Activity   Alcohol use: Yes    Comment: couple times per month   Drug use: No   Sexual activity: Yes  Other Topics Concern   Not on file  Social History Narrative   Not on file   Social Drivers of Health   Financial Resource Strain: Medium Risk (09/19/2023)   Overall Financial Resource Strain (CARDIA)    Difficulty of Paying Living Expenses: Somewhat hard  Food Insecurity: No Food Insecurity (09/19/2023)   Hunger Vital Sign    Worried About Running Out of Food in the Last Year: Never true    Ran Out of Food in the Last Year: Never true  Transportation Needs: No Transportation Needs (09/19/2023)   PRAPARE -  Administrator, Civil Service (Medical): No    Lack of Transportation (Non-Medical): No  Physical Activity: Insufficiently Active (09/19/2023)   Exercise Vital Sign    Days of Exercise per Week: 3 days    Minutes of Exercise per Session: 30 min  Stress: No Stress Concern Present (09/19/2023)   [employer] of Occupational Health - Occupational Stress Questionnaire    Feeling of Stress : Only a little  Social Connections: Moderately Integrated (09/19/2023)   Social Connection and Isolation  Panel    Frequency of Communication with Friends and Family: More than three times a week    Frequency of Social Gatherings with Friends and Family: Twice a week    Attends Religious Services: Never    Database administrator or Organizations: Yes    Attends Engineer, structural: More than 4 times per year    Marital Status: Married  Catering manager Violence: Not on file    Review of Systems:  All other review of systems negative except as mentioned in the HPI.  Physical Exam: Vital signs BP 120/73   Pulse 77   Temp 98 F (36.7 C) (Temporal)   Resp 16   Ht 5' 10 (1.778 m)   Wt 225 lb (102.1 kg)   SpO2 95%   BMI 32.28 kg/m   General:   Alert,  Well-developed, well-nourished, pleasant and cooperative in NAD Airway:  Mallampati 2 Lungs:  Clear throughout to auscultation.   Heart:  Regular rate and rhythm; no murmurs, clicks, rubs,  or gallops. Abdomen:  Soft, nontender and nondistended. Normal bowel sounds.   Neuro/Psych:  Normal mood and affect. A and O x 3  Inocente Hausen, MD [facility]

## 2024-03-08 ENCOUNTER — Ambulatory Visit: Admitting: Pediatrics

## 2024-03-08 ENCOUNTER — Encounter: Payer: Self-pay | Admitting: Pediatrics

## 2024-03-08 VITALS — BP 108/66 | HR 70 | Temp 98.0°F | Resp 17 | Ht 70.0 in | Wt 225.0 lb

## 2024-03-08 DIAGNOSIS — K648 Other hemorrhoids: Secondary | ICD-10-CM

## 2024-03-08 DIAGNOSIS — K3189 Other diseases of stomach and duodenum: Secondary | ICD-10-CM | POA: Diagnosis not present

## 2024-03-08 DIAGNOSIS — K317 Polyp of stomach and duodenum: Secondary | ICD-10-CM | POA: Diagnosis not present

## 2024-03-08 DIAGNOSIS — R194 Change in bowel habit: Secondary | ICD-10-CM | POA: Diagnosis not present

## 2024-03-08 DIAGNOSIS — K449 Diaphragmatic hernia without obstruction or gangrene: Secondary | ICD-10-CM

## 2024-03-08 DIAGNOSIS — K219 Gastro-esophageal reflux disease without esophagitis: Secondary | ICD-10-CM | POA: Diagnosis not present

## 2024-03-08 DIAGNOSIS — K625 Hemorrhage of anus and rectum: Secondary | ICD-10-CM

## 2024-03-08 MED ORDER — SODIUM CHLORIDE 0.9 % IV SOLN: 500.0000 mL | Freq: Once | INTRAVENOUS | Status: DC

## 2024-03-08 NOTE — Progress Notes (Signed)
 A/o x 3, VSS, gd SR's, pleased with anesthesia, report to RN

## 2024-03-08 NOTE — Progress Notes (Signed)
 Called to room to assist during endoscopic procedure.  Patient ID and intended procedure confirmed with present staff. Received instructions for my participation in the procedure from the performing physician.

## 2024-03-08 NOTE — Patient Instructions (Addendum)
 Resume previous diet. Continue present medications. Awaiting pathology results.  Handouts provided on hiatal hernia, polyps, and hemorrhoids. Scheduled follow up appointment.   YOU HAD AN ENDOSCOPIC PROCEDURE TODAY AT THE [facility]:   Refer to the procedure report that was given to you for any specific questions about what was found during the examination.  If the procedure report does not answer your questions, please call your gastroenterologist to clarify.  If you requested that your care partner not be given the details of your procedure findings, then the procedure report has been included in a sealed envelope for you to review at your convenience later.  YOU SHOULD EXPECT: Some feelings of bloating in the abdomen. Passage of more gas than usual.  Walking can help get rid of the air that was put into your GI tract during the procedure and reduce the bloating. If you had a lower endoscopy (such as a colonoscopy or flexible sigmoidoscopy) you may notice spotting of blood in your stool or on the toilet paper. If you underwent a bowel prep for your procedure, you may not have a normal bowel movement for a few days.  Please Note:  You might notice some irritation and congestion in your nose or some drainage.  This is from the oxygen used during your procedure.  There is no need for concern and it should clear up in a day or so.  SYMPTOMS TO REPORT IMMEDIATELY:  Following lower endoscopy (colonoscopy or flexible sigmoidoscopy):  Excessive amounts of blood in the stool  Significant tenderness or worsening of abdominal pains  Swelling of the abdomen that is new, acute  Fever of 100F or higher  Following upper endoscopy (EGD)  Vomiting of blood or coffee ground material  New chest pain or pain under the shoulder blades  Painful or persistently difficult swallowing  New shortness of breath  Fever of 100F or higher  Black, tarry-looking stools  For urgent or emergent issues, a  gastroenterologist can be reached at any hour by calling [phone]. Do not use MyChart messaging for urgent concerns.    DIET:  We do recommend a small meal at first, but then you may proceed to your regular diet.  Drink plenty of fluids but you should avoid alcoholic beverages for 24 hours.  ACTIVITY:  You should plan to take it easy for the rest of today and you should NOT DRIVE or use heavy machinery until tomorrow (because of the sedation medicines used during the test).    FOLLOW UP: Our staff will call the number listed on your records the next business day following your procedure.  We will call around 7:15- 8:00 am to check on you and address any questions or concerns that you may have regarding the information given to you following your procedure. If we do not reach you, we will leave a message.     If any biopsies were taken you will be contacted by phone or by letter within the next 1-3 weeks.  Please call us  at [phone] if you have not heard about the biopsies in 3 weeks.    SIGNATURES/CONFIDENTIALITY: You and/or your care partner have signed paperwork which will be entered into your electronic medical record.  These signatures attest to the fact that that the information above on your After Visit Summary has been reviewed and is understood.  Full responsibility of the confidentiality of this discharge information lies with you and/or your care-partner.

## 2024-03-08 NOTE — Op Note (Signed)
 [facility] Patient Name: Timothy Foster Procedure Date: 03/08/2024 8:31 AM MRN: [id number] Endoscopist: Inocente Hausen , MD, [id number] Age: 38 Referring MD:  Date of Birth: 05/06/1986 Gender: Male Account #: [id number] Procedure:                Upper GI endoscopy Indications:              Follow-up of gastro-esophageal reflux disease,                            Failure to respond to medical treatment, Family                            history of esophageal cancer, Change in bowel habits Medicines:                Monitored Anesthesia Care Procedure:                Pre-Anesthesia Assessment:                           - Prior to the procedure, a History and Physical                            was performed, and patient medications and                            allergies were reviewed. The patient's tolerance of                            previous anesthesia was also reviewed. The risks                            and benefits of the procedure and the sedation                            options and risks were discussed with the patient.                            All questions were answered, and informed consent                            was obtained. Prior Anticoagulants: The patient has                            taken no anticoagulant or antiplatelet agents. ASA                            Grade Assessment: II - A patient with mild systemic                            disease. After reviewing the risks and benefits,                            the patient was deemed in satisfactory condition to  undergo the procedure.                           After obtaining informed consent, the endoscope was                            passed under direct vision. Throughout the                            procedure, the patient's blood pressure, pulse, and                            oxygen saturations were monitored continuously. The                            Olympus  Scope SN [id number] was introduced through the                            mouth, and advanced to the second part of duodenum.                            The upper GI endoscopy was accomplished without                            difficulty. The patient tolerated the procedure                            well. Scope In: Scope Out: Findings:                 The examined esophagus was normal. No evidence of                            Barrett's esophagus. Biopsies were obtained from                            the proximal and distal esophagus with cold forceps                            for histology for evaluation of eosinophilic                            esophagitis.                           The gastric body, gastric antrum, cardia (on                            retroflexion) and gastric fundus (on retroflexion)                            were normal. Biopsies were taken with a cold                            forceps for Helicobacter pylori testing.  A few 3 to 5 mm sessile polyps were found in the                            gastric fundus. Biopsies were taken with a cold                            forceps for histology.                           A small hiatal hernia was present.                           Patchy mildly erythematous mucosa was found in the                            duodenal bulb.                           The second portion of the duodenum was normal.                            Biopsies for histology were taken with a cold                            forceps for evaluation of celiac disease. Complications:            No immediate complications. Estimated blood loss:                            Minimal. Estimated Blood Loss:     Estimated blood loss was minimal. Impression:               - Normal esophagus. Biopsies were taken with a cold                            forceps for evaluation of eosinophilic esophagitis.                           - Normal  gastric body, antrum, cardia and gastric                            fundus. Biopsied.                           - A few gastric polyps. Biopsied.                           - Small hiatal hernia. This could be contributing                            to ongoing reflux.                           - Erythematous duodenopathy.                           -  Normal second portion of the duodenum. Biopsied. Recommendation:           - Await pathology results.                           - Perform a colonoscopy today.                           - The findings and recommendations were discussed                            with the patient's family. Inocente Hausen, MD 03/08/2024 9:10:49 AM This report has been signed electronically.

## 2024-03-08 NOTE — Op Note (Signed)
 [facility] Patient Name: Timothy Foster Procedure Date: 03/08/2024 8:24 AM MRN: [id number] Endoscopist: Inocente Hausen , MD, [id number] Age: 38 Referring MD:  Date of Birth: Nov 26, 1985 Gender: Male Account #: [id number] Procedure:                Colonoscopy Indications:              Last colonoscopy: 2015, Rectal bleeding, Change in                            bowel habits Medicines:                Monitored Anesthesia Care Procedure:                Pre-Anesthesia Assessment:                           - Prior to the procedure, a History and Physical                            was performed, and patient medications and                            allergies were reviewed. The patient's tolerance of                            previous anesthesia was also reviewed. The risks                            and benefits of the procedure and the sedation                            options and risks were discussed with the patient.                            All questions were answered, and informed consent                            was obtained. Prior Anticoagulants: The patient has                            taken no anticoagulant or antiplatelet agents. ASA                            Grade Assessment: II - A patient with mild systemic                            disease. After reviewing the risks and benefits,                            the patient was deemed in satisfactory condition to                            undergo the procedure.  After obtaining informed consent, the colonoscope                            was passed under direct vision. Throughout the                            procedure, the patient's blood pressure, pulse, and                            oxygen saturations were monitored continuously. The                            CF [id number] #[id number] was introduced through the anus                            and advanced to the terminal ileum. The  colonoscopy                            was performed without difficulty. The patient                            tolerated the procedure well. The quality of the                            bowel preparation was good. The terminal ileum,                            ileocecal valve, appendiceal orifice, and rectum                            were photographed. Scope In: 8:51:48 AM Scope Out: 9:04:56 AM Scope Withdrawal Time: 0 hours 9 minutes 34 seconds  Total Procedure Duration: 0 hours 13 minutes 8 seconds  Findings:                 The perianal and digital rectal examinations were                            normal. Pertinent negatives include normal                            sphincter tone and no palpable rectal lesions.                           The colon (entire examined portion) appeared normal.                           The terminal ileum appeared normal.                           Internal hemorrhoids were found during retroflexion. Complications:            No immediate complications. Estimated blood loss:  Minimal. Estimated Blood Loss:     Estimated blood loss was minimal. Impression:               - The entire examined colon is normal.                           - The examined portion of the ileum was normal.                           - Internal hemorrhoids. This is a likely source of                            rectal bleeding.                           - No specimens collected. Recommendation:           - Discharge patient to home (ambulatory).                           - Continue conservative management for treatment of                            hemorrhoids. Use of stool softeners, fiber and                            avoiding straining.                           - The findings and recommendations were discussed                            with the patient's family.                           - Return to GI clinic in 2 months with Dr. Suzann                             or APP.                           - Patient has a contact number available for                            emergencies. The signs and symptoms of potential                            delayed complications were discussed with the                            patient. Return to normal activities tomorrow.                            Written discharge instructions were provided to the  patient. Inocente Hausen, MD 03/08/2024 9:14:23 AM This report has been signed electronically.

## 2024-03-09 ENCOUNTER — Telehealth: Payer: Self-pay | Admitting: *Deleted

## 2024-03-09 NOTE — Telephone Encounter (Signed)
  Follow up Call-     03/08/2024    7:56 AM  Call back number  Post procedure Call Back phone  # [phone]  Permission to leave phone message Yes     Patient questions:  Do you have a fever, pain , or abdominal swelling? No. Pain Score  0 *  Have you tolerated food without any problems? Yes.    Have you been able to return to your normal activities? Yes.    Do you have any questions about your discharge instructions: Diet   No. Medications  No. Follow up visit  No.  Do you have questions or concerns about your Care? No.  Actions: * If pain score is 4 or above: No action needed, pain <4.

## 2024-03-12 ENCOUNTER — Ambulatory Visit: Payer: Self-pay | Admitting: Pediatrics

## 2024-03-12 LAB — SURGICAL PATHOLOGY

## 2024-05-13 ENCOUNTER — Other Ambulatory Visit: Payer: Self-pay | Admitting: Family Medicine

## 2024-05-15 ENCOUNTER — Ambulatory Visit: Admitting: Pediatrics

## 2024-06-08 ENCOUNTER — Encounter: Payer: Self-pay | Admitting: Family Medicine

## 2024-06-12 ENCOUNTER — Ambulatory Visit: Admitting: Family Medicine

## 2024-06-27 ENCOUNTER — Telehealth: Admitting: Emergency Medicine

## 2024-06-27 DIAGNOSIS — B9789 Other viral agents as the cause of diseases classified elsewhere: Secondary | ICD-10-CM | POA: Diagnosis not present

## 2024-06-27 DIAGNOSIS — J019 Acute sinusitis, unspecified: Secondary | ICD-10-CM

## 2024-06-27 MED ORDER — AZELASTINE HCL 0.1 % NA SOLN: 2.0000 | Freq: Two times a day (BID) | NASAL | 0 refills | Status: AC

## 2024-06-27 NOTE — Progress Notes (Signed)
 We are sorry that you are not feeling well.  Here is how we plan to help!  Based on what you have shared with me it looks like you have sinusitis.  Sinusitis is inflammation and infection in the sinus cavities of the head.  Based on your presentation I believe you most likely have Acute Viral Sinusitis.This is an infection most likely caused by a virus. There is not specific treatment for viral sinusitis other than to help you with the symptoms until the infection runs its course.    You may use an oral decongestant such as Mucinex D or if you have glaucoma or high blood pressure use plain Mucinex.   Saline nasal spray help and can safely be used as often as needed for congestion. Try using saline irrigation, such as with a neti pot, several times a day while you are sick. Many neti pots come with salt packets premeasured to use to make saline. If you use your own salt, make sure it is kosher salt or sea salt (don't use table salt as it has iodine in it and you don't need that in your nose). Use distilled water to make saline. If you mix your own saline using your own salt, the recipe is 1/4 teaspoon salt in 1 cup warm water. Using saline irrigation can help prevent and treat sinus infections.   I have prescribed: Azelastine  nasal spray 2 sprays in each nostril twice a day  Some authorities believe that zinc sprays or the use of Echinacea may shorten the course of your symptoms.  Sinus infections are not as easily transmitted as other respiratory infection, however we still recommend that you avoid close contact with loved ones, especially the very young and elderly.  Remember to wash your hands thoroughly throughout the day as this is the number one way to prevent the spread of infection!  Home Care: Only take medications as instructed by your medical team. Do not take these medications with alcohol. A steam or ultrasonic humidifier can help congestion.  You can place a towel over your head and  breathe in the steam from hot water coming from a faucet. Avoid close contacts especially the very young and the elderly. Cover your mouth when you cough or sneeze. Always remember to wash your hands.  Get Help Right Away If: You develop worsening fever or sinus pain. You develop a severe head ache or visual changes. Your symptoms persist after you have completed your treatment plan.  Make sure you Understand these instructions. Will watch your condition. Will get help right away if you are not doing well or get worse.  Your e-visit answers were reviewed by a board certified advanced clinical practitioner to complete your personal care plan.  Depending on the condition, your plan could have included both over the counter or prescription medications.  If there is a problem please reply  once you have received a response from your provider.  Your safety is important to us .  If you have drug allergies check your prescription carefully.    You can use MyChart to ask questions about today's visit, request a non-urgent call back, or ask for a work or school excuse for 24 hours related to this e-Visit. If it has been greater than 24 hours you will need to follow up with your provider, or enter a new e-Visit to address those concerns.  You will get an e-mail in the next two days asking about your experience.  I hope that  your e-visit has been valuable and will speed your recovery. Thank you for using e-visits.  I have spent 5 minutes in review of e-visit questionnaire, review and updating patient chart, medical decision making and response to patient.   Jon Belt, PhD, FNP-BC

## 2024-06-29 ENCOUNTER — Other Ambulatory Visit: Payer: Self-pay | Admitting: Family Medicine

## 2024-07-12 NOTE — Progress Notes (Unsigned)
 [facility] Return Visit   Referring Provider Micheal Wolm ORN, MD [address] [place],  [place] [address]  Primary Care Provider Burchette, Wolm ORN, MD  Patient Profile: Timothy Foster is a 38 y.o. male who returns to the [facility] for follow-up of the problem(s) noted below.  Problem List: GERD Hiatal hernia Rectal bleeding secondary to internal hemorrhoids History of Vitamin B12 deficiency Family history of gastrointestinal malignancies -colon cancer, pancreatic cancer, stomach cancer, esophageal cancer   History of Present Illness    Discussed the use of AI scribe software for clinical note transcription with the patient, who gave verbal consent to proceed.  History of Present Illness Timothy Foster is a 38 year old male with a past medical history noteworthy for dyslipidemia, HTN, migraines, depression, anxiety and environmental allergies who returns to the gastroenterology office for follow-up of GERD and rectal bleeding secondary to internal hemorrhoids   Current GI Meds  Omeprazole  40 mg orally daily  Interval History  Since last visit, Timothy Foster underwent EGD and colonoscopy 03/08/2024  EGD 03/08/2024 Normal esophagus and stomach Small hiatal hernia A few fundic gland polyps Erythematous duodenopathy  Path: Normal esophageal biopsies; gastric changes with PPI effect and fundic gland polyp; normal duodenum  Colonoscopy 03/08/2024 Normal colon and terminal ileum Internal hemorrhoids present  Gastroesophageal reflux symptoms - Reflux symptoms improved since recent procedures - Continues on omeprazole  40 mg orally nightly -noted that he is taking this before bed and not before meal -discussed changing timing - Occasional nocturnal flares with coughing and burning in the throat, sometimes waking from sleep - Avoids spicy foods and late meals, but 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  bedtime - Uses antacids for breakthrough symptoms - Elevates head with two pillows for symptom relief - Working on lifestyle modifications to control symptoms  Colorectal symptoms - History of rectal bleeding with prior colonoscopy showing hemorrhoids - No further rectal bleeding since colonoscopy - Regular bowel movements twice daily - Uses daily fiber supplement - No diarrhea, constipation, gas, or bloating  - Normal colonoscopies in 2015 and 2025 -10-year recall advised for next colonoscopy  Family history of gastrointestinal malignancies - Colon cancer-paternal uncle - Pancreatic cancer-paternal uncle - Gastric cancer-paternal grandfather - Esophageal cancer-father   GI Review of Symptoms Significant for occasional GERD. Otherwise negative.  General Review of Systems  Review of systems is significant for the pertinent positives and negatives as listed per the HPI.  Full ROS is otherwise negative.  Past Medical History   Past Medical History:  Diagnosis Date   ADD (attention deficit disorder)    Allergy    Anxiety    B12 deficiency    Chicken pox    Depression    GERD (gastroesophageal reflux disease)    Headache(784.0)    Hyperlipidemia    Internal hemorrhoids    Migraine      Past Surgical History   Past Surgical History:  Procedure Laterality Date   WISDOM TOOTH EXTRACTION       Allergies and Medications   Allergies[1]  azelastine  fenofibrate  fexofenadine  hydrocortisone  Micardis  Tabs omeprazole  SUMAtriptan  tadalafil  topiramate  traZODone  venlafaxine  XR   Family His   Family History  Problem Relation Age of Onset   Diabetes Mother    Stroke Father    Diabetes Father    Esophageal cancer Father    Alcohol abuse Maternal Uncle    Alcohol abuse Paternal Uncle    Cancer Paternal Uncle  Pancreatic cancer Paternal Uncle    Colon cancer Paternal Uncle    Crohn's disease Maternal Aunt    Stomach cancer Paternal Grandfather     Social  History   Social History[2] Timothy Foster reports that he has never smoked. He has never used smokeless tobacco. He reports current alcohol use. He reports that he does not use drugs.  Employed as a financial controller  Vital Signs and Physical Examination   Vitals:   07/13/24 1615  BP: 114/72  Pulse: 99  SpO2: 99%   Body mass index is 33 kg/m. Weight: 230 lb (104.3 kg)  General: Well developed, well nourished, no acute distress Head: Normocephalic and atraumatic Eyes: Sclerae anicteric, EOMI Lungs: Clear throughout to auscultation Heart: Regular rate and rhythm; No murmurs, rubs or bruits Abdomen: Soft, non tender and non distended. No masses, hepatosplenomegaly or hernias noted. Normal Bowel sounds Rectal: Referred Musculoskeletal: Symmetrical with no gross deformities    Review of Data   The following data was reviewed at the time of this encounter:   Laboratory Studies      Latest Ref Rng & Units 02/01/2024   10:20 AM 11/25/2023    9:09 AM 10/03/2020   10:54 AM  CBC  WBC 4.0 - 10.5 K/uL 5.0  6.3  5.5   Hemoglobin 13.0 - 17.0 g/dL 85.6  84.5  84.8   Hematocrit 39.0 - 52.0 % 40.9  43.7  42.9   Platelets 150.0 - 400.0 K/uL 253.0  241.0  252.0     No results found for: LIPASE    Latest Ref Rng & Units 02/01/2024   10:20 AM 11/25/2023    9:09 AM 09/01/2022   10:00 AM  CMP  Glucose 70 - 99 mg/dL 893  94  897   BUN 6 - 23 mg/dL 17  16  16    Creatinine 0.40 - 1.50 mg/dL 8.52  8.55  8.57   Sodium 135 - 145 mEq/L 140  140  140   Potassium 3.5 - 5.1 mEq/L 3.8  3.7  4.1   Chloride 96 - 112 mEq/L 109  106  108   CO2 19 - 32 mEq/L 23  25  25    Calcium 8.4 - 10.5 mg/dL 9.9  9.4  9.8   Total Protein 6.0 - 8.3 g/dL 7.3  7.3  7.3   Total Bilirubin 0.2 - 1.2 mg/dL 0.6  0.6  0.4   Alkaline Phos 39 - 117 U/L 56  53  66   AST 0 - 37 U/L 18  26  22    ALT 0 - 53 U/L 29  39  44    2022-vitamin B12 199  01/2024 -negative antiparietal cell and intrinsic factor antibodies  Imaging  Studies  None  GI Procedures and Studies  EGD 03/08/2024 Normal esophagus and stomach Small hiatal hernia A few fundic gland polyps Erythematous duodenopathy  Path: Normal esophageal biopsies; gastric changes with PPI effect and fundic gland polyp; normal duodenum  Colonoscopy 03/08/2024 Normal colon and terminal ileum Internal hemorrhoids present  Colonoscopy 08/07/2013 Good prep, normal exam Small internal hemorrhoids  Clinical Impression  It is my clinical impression that Timothy Foster is a 38 y.o. male with;  GERD Hiatal hernia Rectal bleeding secondary to internal hemorrhoids History of Vitamin B12 deficiency Family history of gastrointestinal malignancies -colon cancer, pancreatic cancer, stomach cancer, esophageal cancer  Timothy Foster returns to the office today for follow-up of GERD and history of rectal bleeding.  Since last visit he underwent EGD/colonoscopy 03/2024.  His upper endoscopy disclosed a small hiatal hernia, fundic gland polyps and benign erythematous duodenopathy.  There was no evidence of EOE, celiac disease or H. pylori infection.  He reports that his GERD is currently better controlled.  He is continuing on omeprazole  40 mg orally nightly.  At that time of our interview today I learned that he was taking his medication at bedtime.  Advised transitioning his omeprazole  to 20 to 30 minutes before meal.  This may reduce breakthrough symptoms and need for Tums.  He is attempting to avoid spicy foods and other triggers.  Sleeps with his head elevated.  His EGD disclosed a hiatal hernia which could be contributing to acid reflux.  At the time of last clinic visit he was endorsing symptoms of rectal bleeding.  Colonoscopy was reassuring with only a finding of internal hemorrhoids.  Both colonoscopies in 2015 and 2025 have been unremarkable.  A 10-year follow-up interval is advised for next screening colonoscopy in 2035.  Timothy Foster does have a strong family history of colon cancers  including colorectal cancer, pancreatic cancer, gastric cancer and esophageal cancer.  At future visits it will be important to review family history to see if there are any changes that would necessitate altering his screening or evaluation for gastrointestinal malignancies.  He was previously noted to have a low vitamin B12 level in 2022.  Laboratory studies for anti-intrinsic factor and antiparietal cell antibodies in 2025 were negative.  No findings to suggest autoimmune gastritis or pernicious anemia on EGD 2025.  Plan  Continue omeprazole  40 mg orally daily-adjust timing of taking the medication to 20 to 30 minutes before meal GERD diet and lifestyle modification Incorporate fiber in diet and maintain adequate hydration to limit bleeding from internal hemorrhoids If bleeding from internal hemorrhoids recurs and does not remit with conservative measures can consider trial of hydrocortisone  suppositories Next screening colonoscopy due 2035 Monitor family history of gastrointestinal malignancies in the event new malignancies occur and impact screening intervals/recommendations   Planned Follow Up Return if symptoms worsen or fail to improve.  The patient or caregiver verbalized understanding of the material covered, with no barriers to understanding. All questions were answered. Patient or caregiver is agreeable with the plan outlined above.    It was a pleasure to see Timothy Foster.  If you have any questions or concerns regarding this evaluation, do not hesitate to contact me.  Inocente Hausen, MD [facility]  I spent total of 25 minutes in both face-to-face (10 minutes interview) and non-face-to-face (15 minutes chart review, care coordination, documentation)  activities, excluding procedures performed, for the visit on the date of this encounter.      [1]  Allergies Allergen Reactions   Penicillins Anaphylaxis    Hives as a child Hives as a child  [2]  Social  History Tobacco Use   Smoking status: Never   Smokeless tobacco: Never  Vaping Use   Vaping status: Never Used  Substance Use Topics   Alcohol use: Yes    Comment: couple times per month   Drug use: No

## 2024-07-13 ENCOUNTER — Ambulatory Visit: Admitting: Pediatrics

## 2024-07-13 ENCOUNTER — Encounter: Payer: Self-pay | Admitting: Pediatrics

## 2024-07-13 VITALS — BP 114/72 | HR 99 | Ht 70.0 in | Wt 230.0 lb

## 2024-07-13 DIAGNOSIS — K649 Unspecified hemorrhoids: Secondary | ICD-10-CM

## 2024-07-13 DIAGNOSIS — K219 Gastro-esophageal reflux disease without esophagitis: Secondary | ICD-10-CM

## 2024-07-13 DIAGNOSIS — E538 Deficiency of other specified B group vitamins: Secondary | ICD-10-CM

## 2024-07-13 DIAGNOSIS — Z800 Family history of malignant neoplasm of digestive organs: Secondary | ICD-10-CM

## 2024-07-13 NOTE — Patient Instructions (Signed)
 Thank you for entrusting me with your care and for choosing [facility], Dr. Inocente Hausen  _______________________________________________________  If your blood pressure at your visit was 140/90 or greater, please contact your primary care physician to follow up on this.  _______________________________________________________  If you are age 39 or older, your body mass index should be between 23-30. Your Body mass index is 33 kg/m. If this is out of the aforementioned range listed, please consider follow up with your Primary Care Provider.  If you are age 20 or younger, your body mass index should be between 19-25. Your Body mass index is 33 kg/m. If this is out of the aformentioned range listed, please consider follow up with your Primary Care Provider.   ________________________________________________________  The [facility] providers would like to encourage you to use MYCHART to communicate with providers for non-urgent requests or questions.  Due to long hold times on the telephone, sending your provider a message by [facility] may be a faster and more efficient way to get a response.  Please allow 48 business hours for a response.  Please remember that this is for non-urgent requests.  _______________________________________________________  [facility] is using a team-based approach to care.  Your team is made up of your doctor and two to three APPS. Our APPS (Nurse Practitioners and Physician Assistants) work with your physician to ensure care continuity for you. They are fully qualified to address your health concerns and develop a treatment plan. They communicate directly with your gastroenterologist to care for you. Seeing the Advanced Practice Practitioners on your physician's team can help you by facilitating care more promptly, often allowing for earlier appointments, access to diagnostic testing, procedures, and other specialty referrals.

## 2024-07-19 ENCOUNTER — Encounter: Payer: Self-pay | Admitting: Family Medicine

## 2024-07-19 MED ORDER — TOPIRAMATE 100 MG PO TABS: 100.0000 mg | ORAL_TABLET | Freq: Every day | ORAL | 0 refills | Status: AC

## 2024-07-19 MED ORDER — OMEPRAZOLE 40 MG PO CPDR: 40.0000 mg | DELAYED_RELEASE_CAPSULE | Freq: Every day | ORAL | 0 refills | Status: AC

## 2024-07-19 MED ORDER — VENLAFAXINE HCL ER 150 MG PO CP24: 150.0000 mg | ORAL_CAPSULE | Freq: Every day | ORAL | 0 refills | Status: AC

## 2024-07-29 ENCOUNTER — Other Ambulatory Visit: Payer: Self-pay | Admitting: Emergency Medicine
